# Patient Record
Sex: Male | Born: 1960
Health system: Southern US, Community
[De-identification: ages and names within clinical notes are randomized; demographics above are authoritative.]

## PROBLEM LIST (undated history)

## (undated) DIAGNOSIS — K219 Gastro-esophageal reflux disease without esophagitis: Secondary | ICD-10-CM

## (undated) DIAGNOSIS — F419 Anxiety disorder, unspecified: Secondary | ICD-10-CM

## (undated) DIAGNOSIS — I10 Essential (primary) hypertension: Secondary | ICD-10-CM

## (undated) DIAGNOSIS — R0602 Shortness of breath: Secondary | ICD-10-CM

## (undated) DIAGNOSIS — M109 Gout, unspecified: Secondary | ICD-10-CM

## (undated) DIAGNOSIS — L719 Rosacea, unspecified: Secondary | ICD-10-CM

## (undated) HISTORY — PX: BREAST LUMPECTOMY: SHX2

---

## 2006-08-12 ENCOUNTER — Emergency Department: Payer: Self-pay | Admitting: Emergency Medicine

## 2007-04-24 ENCOUNTER — Other Ambulatory Visit: Payer: Self-pay

## 2007-04-24 ENCOUNTER — Emergency Department: Payer: Self-pay | Admitting: Emergency Medicine

## 2007-09-09 ENCOUNTER — Emergency Department: Payer: Self-pay | Admitting: Emergency Medicine

## 2010-11-06 ENCOUNTER — Emergency Department: Payer: Self-pay | Admitting: Emergency Medicine

## 2012-02-26 ENCOUNTER — Ambulatory Visit: Payer: Self-pay | Admitting: Sports Medicine

## 2012-03-17 ENCOUNTER — Ambulatory Visit: Payer: Self-pay | Admitting: Sports Medicine

## 2012-04-23 ENCOUNTER — Other Ambulatory Visit: Payer: Self-pay | Admitting: Neurosurgery

## 2012-05-05 ENCOUNTER — Encounter (HOSPITAL_COMMUNITY): Payer: Self-pay

## 2012-05-09 ENCOUNTER — Other Ambulatory Visit (HOSPITAL_COMMUNITY): Payer: Self-pay

## 2012-05-12 ENCOUNTER — Encounter (HOSPITAL_COMMUNITY): Payer: Self-pay

## 2012-05-12 ENCOUNTER — Encounter (HOSPITAL_COMMUNITY)
Admission: RE | Admit: 2012-05-12 | Discharge: 2012-05-12 | Disposition: A | Discharge status: Home / Self Care | Payer: BC Managed Care – PPO | Source: Ambulatory Visit | Attending: Neurosurgery | Admitting: Neurosurgery

## 2012-05-12 HISTORY — DX: Shortness of breath: R06.02

## 2012-05-12 HISTORY — DX: Gastro-esophageal reflux disease without esophagitis: K21.9

## 2012-05-12 HISTORY — DX: Gout, unspecified: M10.9

## 2012-05-12 LAB — SURGICAL PCR SCREEN
MRSA, PCR: NEGATIVE
Staphylococcus aureus: NEGATIVE

## 2012-05-12 NOTE — Pre-Procedure Instructions (Signed)
Fordyce Lepak  05/12/2012   Your procedure is scheduled on:  05-19-2012  Report to [facility] at 5:30  AM.  Call this number if you have problems the morning of surgery: [phone]   Remember:   Do not eat food or drink liquids after midnight.   Take these medicines the morning of surgery with A SIP OF WATER:Allopurinol    Do not wear jewelry Do not wear lotions, powders, or perfumes. You may wear deodorant.  Do not shave 48 hours prior to surgery. Men may shave face and neck.  Do not bring valuables to the hospital.  Contacts, dentures or bridgework may not be worn into surgery.  Leave suitcase in the car. After surgery it may be brought to your room.   For patients admitted to the hospital, checkout time is 11:00 AM the day of  discharge.   Patients discharged the day of surgery will not be allowed to drive home.   Special Instructions: Shower using CHG 2 nights before surgery and the night before surgery.  If you shower the day of surgery use CHG.  Use special wash - you have one bottle of CHG for all showers.  You should use approximately 1/3 of the bottle for each shower.            STOP ALL ASPIRIN,NASIDS, AND BLOOD THINNERS   Please read over the following fact sheets that you were given: Pain Booklet, Coughing and Deep Breathing and Surgical Site Infection Prevention

## 2012-05-12 NOTE — Progress Notes (Signed)
Pt. Had labs drawn last week @ Dr. Minus Liberty ,[facility],requested copy of lab work.  Requested Stress test ,Echo and Sleep study from Dr. Earnestine Leys [facility].  Pt. Never given results from sleep study, new pcp to follow up .,he is aware that pt. Is at risk for sleep apnea.

## 2012-05-15 NOTE — Progress Notes (Signed)
Received office notes and Echo report from Dr. Juel Burrow in [place].  I called the office and they state they do not have a sleep study report. Also no stress test report.

## 2012-05-18 MED ORDER — DEXTROSE 5 % IV SOLN
3.0000 g | INTRAVENOUS | Status: AC
  Administered 2012-05-19: 3 g via INTRAVENOUS
  Filled 2012-05-18: qty 3000

## 2012-05-19 ENCOUNTER — Ambulatory Visit (HOSPITAL_COMMUNITY): Payer: BC Managed Care – PPO

## 2012-05-19 ENCOUNTER — Observation Stay (HOSPITAL_COMMUNITY)
Admission: RE | Admit: 2012-05-19 | Discharge: 2012-05-20 | DRG: 864 | Disposition: A | Discharge status: Home / Self Care | Payer: BC Managed Care – PPO | Source: Ambulatory Visit | Attending: Neurosurgery | Admitting: Neurosurgery

## 2012-05-19 ENCOUNTER — Encounter (HOSPITAL_COMMUNITY): Payer: Self-pay | Admitting: *Deleted

## 2012-05-19 ENCOUNTER — Encounter (HOSPITAL_COMMUNITY)
Admission: RE | Disposition: A | Discharge status: Home / Self Care | Payer: Self-pay | Source: Ambulatory Visit | Attending: Neurosurgery

## 2012-05-19 ENCOUNTER — Encounter (HOSPITAL_COMMUNITY): Payer: Self-pay | Admitting: Certified Registered"

## 2012-05-19 ENCOUNTER — Ambulatory Visit (HOSPITAL_COMMUNITY): Payer: BC Managed Care – PPO | Admitting: Certified Registered"

## 2012-05-19 DIAGNOSIS — Z79899 Other long term (current) drug therapy: Secondary | ICD-10-CM | POA: Insufficient documentation

## 2012-05-19 DIAGNOSIS — Z01812 Encounter for preprocedural laboratory examination: Secondary | ICD-10-CM | POA: Insufficient documentation

## 2012-05-19 DIAGNOSIS — M4712 Other spondylosis with myelopathy, cervical region: Secondary | ICD-10-CM | POA: Insufficient documentation

## 2012-05-19 DIAGNOSIS — Z01810 Encounter for preprocedural cardiovascular examination: Secondary | ICD-10-CM | POA: Insufficient documentation

## 2012-05-19 DIAGNOSIS — K219 Gastro-esophageal reflux disease without esophagitis: Secondary | ICD-10-CM | POA: Insufficient documentation

## 2012-05-19 DIAGNOSIS — M5000 Cervical disc disorder with myelopathy, unspecified cervical region: Principal | ICD-10-CM | POA: Insufficient documentation

## 2012-05-19 DIAGNOSIS — G4730 Sleep apnea, unspecified: Secondary | ICD-10-CM | POA: Insufficient documentation

## 2012-05-19 DIAGNOSIS — M109 Gout, unspecified: Secondary | ICD-10-CM | POA: Insufficient documentation

## 2012-05-19 HISTORY — PX: ANTERIOR CERVICAL DECOMP/DISCECTOMY FUSION: SHX1161

## 2012-05-19 SURGERY — ANTERIOR CERVICAL DECOMPRESSION/DISCECTOMY FUSION 2 LEVELS
Anesthesia: General | Site: Spine Cervical | Wound class: Clean

## 2012-05-19 MED ORDER — EPHEDRINE SULFATE 50 MG/ML IJ SOLN
INTRAMUSCULAR | Status: DC | PRN
  Administered 2012-05-19: 10 mg via INTRAVENOUS

## 2012-05-19 MED ORDER — ONDANSETRON HCL 4 MG/2ML IJ SOLN
INTRAMUSCULAR | Status: DC | PRN
  Administered 2012-05-19: 4 mg via INTRAVENOUS

## 2012-05-19 MED ORDER — THERA M PLUS PO TABS: 1.0000 | ORAL_TABLET | Freq: Every day | ORAL | Status: DC

## 2012-05-19 MED ORDER — THROMBIN 5000 UNITS EX SOLR
CUTANEOUS | Status: DC | PRN
  Administered 2012-05-19 (×2): 5000 [IU] via TOPICAL

## 2012-05-19 MED ORDER — LORAZEPAM 2 MG/ML IJ SOLN: 1.0000 mg | Freq: Four times a day (QID) | INTRAMUSCULAR | Status: DC | PRN

## 2012-05-19 MED ORDER — ALLOPURINOL 300 MG PO TABS
300.0000 mg | ORAL_TABLET | Freq: Every day | ORAL | Status: DC
  Administered 2012-05-20: 300 mg via ORAL
  Filled 2012-05-19 (×2): qty 1

## 2012-05-19 MED ORDER — MORPHINE SULFATE 2 MG/ML IJ SOLN
1.0000 mg | INTRAMUSCULAR | Status: DC | PRN
  Administered 2012-05-19: 4 mg via INTRAVENOUS
  Filled 2012-05-19: qty 2

## 2012-05-19 MED ORDER — CEFAZOLIN SODIUM-DEXTROSE 2-3 GM-% IV SOLR
2.0000 g | Freq: Three times a day (TID) | INTRAVENOUS | Status: AC
  Administered 2012-05-19 (×2): 2 g via INTRAVENOUS
  Filled 2012-05-19 (×2): qty 50

## 2012-05-19 MED ORDER — NEOSTIGMINE METHYLSULFATE 1 MG/ML IJ SOLN
INTRAMUSCULAR | Status: DC | PRN
  Administered 2012-05-19: 4 mg via INTRAVENOUS

## 2012-05-19 MED ORDER — GLYCOPYRROLATE 0.2 MG/ML IJ SOLN
INTRAMUSCULAR | Status: DC | PRN
  Administered 2012-05-19: 0.6 mg via INTRAVENOUS

## 2012-05-19 MED ORDER — ROCURONIUM BROMIDE 100 MG/10ML IV SOLN
INTRAVENOUS | Status: DC | PRN
  Administered 2012-05-19: 50 mg via INTRAVENOUS

## 2012-05-19 MED ORDER — LORAZEPAM 0.5 MG PO TABS: 1.0000 mg | ORAL_TABLET | Freq: Four times a day (QID) | ORAL | Status: DC | PRN

## 2012-05-19 MED ORDER — SODIUM CHLORIDE 0.9 % IR SOLN
Status: DC | PRN
  Administered 2012-05-19: 10:00:00

## 2012-05-19 MED ORDER — LACTATED RINGERS IV SOLN: INTRAVENOUS | Status: DC

## 2012-05-19 MED ORDER — FENTANYL CITRATE 0.05 MG/ML IJ SOLN
INTRAMUSCULAR | Status: DC | PRN
  Administered 2012-05-19: 250 ug via INTRAVENOUS
  Administered 2012-05-19: 50 ug via INTRAVENOUS
  Administered 2012-05-19: 25 ug via INTRAVENOUS

## 2012-05-19 MED ORDER — VECURONIUM BROMIDE 10 MG IV SOLR
INTRAVENOUS | Status: DC | PRN
  Administered 2012-05-19: 2 mg via INTRAVENOUS

## 2012-05-19 MED ORDER — SODIUM CHLORIDE 0.9 % IV SOLN
INTRAVENOUS | Status: AC
  Filled 2012-05-19: qty 500

## 2012-05-19 MED ORDER — PHENYLEPHRINE HCL 10 MG/ML IJ SOLN
INTRAMUSCULAR | Status: DC | PRN
  Administered 2012-05-19 (×2): 80 ug via INTRAVENOUS

## 2012-05-19 MED ORDER — OXYCODONE-ACETAMINOPHEN 5-325 MG PO TABS
1.0000 | ORAL_TABLET | ORAL | Status: DC | PRN
  Administered 2012-05-19 – 2012-05-20 (×4): 2 via ORAL
  Filled 2012-05-19 (×4): qty 2

## 2012-05-19 MED ORDER — ADULT MULTIVITAMIN W/MINERALS CH: 1.0000 | ORAL_TABLET | Freq: Every day | ORAL | Status: DC

## 2012-05-19 MED ORDER — HEMOSTATIC AGENTS (NO CHARGE) OPTIME
TOPICAL | Status: DC | PRN
  Administered 2012-05-19: 1 via TOPICAL

## 2012-05-19 MED ORDER — VITAMIN B-1 100 MG PO TABS
100.0000 mg | ORAL_TABLET | Freq: Every day | ORAL | Status: DC
  Administered 2012-05-19 – 2012-05-20 (×2): 100 mg via ORAL
  Filled 2012-05-19 (×2): qty 1

## 2012-05-19 MED ORDER — LIDOCAINE HCL 4 % MT SOLN
OROMUCOSAL | Status: DC | PRN
  Administered 2012-05-19: 4 mL via TOPICAL

## 2012-05-19 MED ORDER — DEXAMETHASONE SODIUM PHOSPHATE 10 MG/ML IJ SOLN
INTRAMUSCULAR | Status: DC | PRN
  Administered 2012-05-19: 10 mg via INTRAVENOUS

## 2012-05-19 MED ORDER — LIDOCAINE HCL (CARDIAC) 20 MG/ML IV SOLN
INTRAVENOUS | Status: DC | PRN
  Administered 2012-05-19: 70 mg via INTRAVENOUS

## 2012-05-19 MED ORDER — ONDANSETRON HCL 4 MG/2ML IJ SOLN
INTRAMUSCULAR | Status: AC
  Filled 2012-05-19: qty 2

## 2012-05-19 MED ORDER — BACITRACIN ZINC 500 UNIT/GM EX OINT
TOPICAL_OINTMENT | CUTANEOUS | Status: DC | PRN
  Administered 2012-05-19: 1 via TOPICAL

## 2012-05-19 MED ORDER — LACTATED RINGERS IV SOLN
INTRAVENOUS | Status: DC | PRN
  Administered 2012-05-19 (×2): via INTRAVENOUS

## 2012-05-19 MED ORDER — HYDROMORPHONE HCL PF 1 MG/ML IJ SOLN: 0.2500 mg | INTRAMUSCULAR | Status: DC | PRN

## 2012-05-19 MED ORDER — DEXAMETHASONE 4 MG PO TABS
4.0000 mg | ORAL_TABLET | Freq: Four times a day (QID) | ORAL | Status: AC
  Administered 2012-05-19 – 2012-05-20 (×2): 4 mg via ORAL
  Filled 2012-05-19 (×2): qty 1

## 2012-05-19 MED ORDER — MORPHINE SULFATE 2 MG/ML IJ SOLN
INTRAMUSCULAR | Status: AC
  Administered 2012-05-19: 2 mg via INTRAVENOUS
  Filled 2012-05-19: qty 1

## 2012-05-19 MED ORDER — BACITRACIN 50000 UNITS IM SOLR
INTRAMUSCULAR | Status: AC
  Filled 2012-05-19: qty 1

## 2012-05-19 MED ORDER — 0.9 % SODIUM CHLORIDE (POUR BTL) OPTIME
TOPICAL | Status: DC | PRN
  Administered 2012-05-19: 1000 mL

## 2012-05-19 MED ORDER — DEXAMETHASONE SODIUM PHOSPHATE 4 MG/ML IJ SOLN
4.0000 mg | Freq: Four times a day (QID) | INTRAMUSCULAR | Status: AC
  Administered 2012-05-19: 4 mg via INTRAVENOUS
  Filled 2012-05-19: qty 1

## 2012-05-19 MED ORDER — DIAZEPAM 5 MG PO TABS
5.0000 mg | ORAL_TABLET | Freq: Four times a day (QID) | ORAL | Status: DC | PRN
  Administered 2012-05-19 – 2012-05-20 (×3): 5 mg via ORAL
  Filled 2012-05-19 (×4): qty 1

## 2012-05-19 MED ORDER — PROPOFOL 10 MG/ML IV BOLUS
INTRAVENOUS | Status: DC | PRN
  Administered 2012-05-19: 50 mg via INTRAVENOUS
  Administered 2012-05-19: 350 mg via INTRAVENOUS

## 2012-05-19 MED ORDER — THIAMINE HCL 100 MG/ML IJ SOLN
100.0000 mg | Freq: Every day | INTRAMUSCULAR | Status: DC
  Filled 2012-05-19 (×2): qty 1

## 2012-05-19 MED ORDER — BUPIVACAINE-EPINEPHRINE PF 0.5-1:200000 % IJ SOLN
INTRAMUSCULAR | Status: DC | PRN
  Administered 2012-05-19: 10 mL

## 2012-05-19 MED ORDER — ACETAMINOPHEN 650 MG RE SUPP: 650.0000 mg | RECTAL | Status: DC | PRN

## 2012-05-19 MED ORDER — ONDANSETRON HCL 4 MG/2ML IJ SOLN: 4.0000 mg | INTRAMUSCULAR | Status: DC | PRN

## 2012-05-19 MED ORDER — ACETAMINOPHEN 325 MG PO TABS: 650.0000 mg | ORAL_TABLET | ORAL | Status: DC | PRN

## 2012-05-19 MED ORDER — ONDANSETRON HCL 4 MG/2ML IJ SOLN
4.0000 mg | Freq: Once | INTRAMUSCULAR | Status: AC | PRN
  Administered 2012-05-19: 4 mg via INTRAVENOUS

## 2012-05-19 MED ORDER — HYDROCODONE-ACETAMINOPHEN 5-325 MG PO TABS: 1.0000 | ORAL_TABLET | ORAL | Status: DC | PRN

## 2012-05-19 MED ORDER — MENTHOL 3 MG MT LOZG
1.0000 | LOZENGE | OROMUCOSAL | Status: DC | PRN
  Filled 2012-05-19: qty 9

## 2012-05-19 MED ORDER — SUCCINYLCHOLINE CHLORIDE 20 MG/ML IJ SOLN
INTRAMUSCULAR | Status: DC | PRN
  Administered 2012-05-19: 120 mg via INTRAVENOUS

## 2012-05-19 MED ORDER — DOCUSATE SODIUM 100 MG PO CAPS
100.0000 mg | ORAL_CAPSULE | Freq: Two times a day (BID) | ORAL | Status: DC
  Administered 2012-05-19 – 2012-05-20 (×2): 100 mg via ORAL
  Filled 2012-05-19 (×2): qty 1

## 2012-05-19 MED ORDER — PHENOL 1.4 % MT LIQD: 1.0000 | OROMUCOSAL | Status: DC | PRN

## 2012-05-19 MED ORDER — ALUM & MAG HYDROXIDE-SIMETH 200-200-20 MG/5ML PO SUSP: 30.0000 mL | Freq: Four times a day (QID) | ORAL | Status: DC | PRN

## 2012-05-19 MED ORDER — ADULT MULTIVITAMIN W/MINERALS CH
1.0000 | ORAL_TABLET | Freq: Every day | ORAL | Status: DC
  Administered 2012-05-19 – 2012-05-20 (×2): 1 via ORAL
  Filled 2012-05-19 (×2): qty 1

## 2012-05-19 MED ORDER — FOLIC ACID 1 MG PO TABS
1.0000 mg | ORAL_TABLET | Freq: Every day | ORAL | Status: DC
  Administered 2012-05-19 – 2012-05-20 (×2): 1 mg via ORAL
  Filled 2012-05-19 (×2): qty 1

## 2012-05-19 SURGICAL SUPPLY — 62 items
BAG DECANTER FOR FLEXI CONT (MISCELLANEOUS) ×2 IMPLANT
BENZOIN TINCTURE PRP APPL 2/3 (GAUZE/BANDAGES/DRESSINGS) ×2 IMPLANT
BIT DRILL NEURO 2X3.1 SFT TUCH (MISCELLANEOUS) ×1 IMPLANT
BLADE SURG 15 STRL LF DISP TIS (BLADE) ×1 IMPLANT
BLADE SURG 15 STRL SS (BLADE) ×1
BLADE ULTRA TIP 2M (BLADE) ×2 IMPLANT
BRUSH SCRUB EZ PLAIN DRY (MISCELLANEOUS) ×4 IMPLANT
BUR BARREL STRAIGHT FLUTE 4.0 (BURR) ×2 IMPLANT
BUR MATCHSTICK NEURO 3.0 LAGG (BURR) ×2 IMPLANT
CANISTER SUCTION 2500CC (MISCELLANEOUS) ×2 IMPLANT
CLOTH BEACON ORANGE TIMEOUT ST (SAFETY) ×2 IMPLANT
CONT SPEC 4OZ CLIKSEAL STRL BL (MISCELLANEOUS) ×2 IMPLANT
COVER MAYO STAND STRL (DRAPES) ×2 IMPLANT
DRAIN JACKSON PRATT 10MM FLAT (MISCELLANEOUS) ×2 IMPLANT
DRAPE LAPAROTOMY 100X72 PEDS (DRAPES) ×2 IMPLANT
DRAPE MICROSCOPE LEICA (MISCELLANEOUS) IMPLANT
DRAPE POUCH INSTRU U-SHP 10X18 (DRAPES) ×2 IMPLANT
DRAPE PROXIMA HALF (DRAPES) ×2 IMPLANT
DRAPE SURG 17X23 STRL (DRAPES) ×4 IMPLANT
DRILL NEURO 2X3.1 SOFT TOUCH (MISCELLANEOUS) ×2
ELECT BLADE 4.0 EZ CLEAN MEGAD (MISCELLANEOUS) ×2
ELECT REM PT RETURN 9FT ADLT (ELECTROSURGICAL) ×2
ELECTRODE BLDE 4.0 EZ CLN MEGD (MISCELLANEOUS) ×1 IMPLANT
ELECTRODE REM PT RTRN 9FT ADLT (ELECTROSURGICAL) ×1 IMPLANT
EVACUATOR SILICONE 100CC (DRAIN) ×2 IMPLANT
GAUZE SPONGE 4X4 16PLY XRAY LF (GAUZE/BANDAGES/DRESSINGS) IMPLANT
GLOVE BIO SURGEON STRL SZ8.5 (GLOVE) ×2 IMPLANT
GLOVE EXAM NITRILE LRG STRL (GLOVE) IMPLANT
GLOVE EXAM NITRILE MD LF STRL (GLOVE) IMPLANT
GLOVE EXAM NITRILE XL STR (GLOVE) IMPLANT
GLOVE EXAM NITRILE XS STR PU (GLOVE) IMPLANT
GLOVE SS BIOGEL STRL SZ 8 (GLOVE) ×1 IMPLANT
GLOVE SUPERSENSE BIOGEL SZ 8 (GLOVE) ×1
GOWN BRE IMP SLV AUR LG STRL (GOWN DISPOSABLE) IMPLANT
GOWN BRE IMP SLV AUR XL STRL (GOWN DISPOSABLE) ×2 IMPLANT
KIT BASIN OR (CUSTOM PROCEDURE TRAY) ×2 IMPLANT
KIT ROOM TURNOVER OR (KITS) ×2 IMPLANT
MARKER SKIN DUAL TIP RULER LAB (MISCELLANEOUS) ×2 IMPLANT
NEEDLE HYPO 22GX1.5 SAFETY (NEEDLE) ×2 IMPLANT
NEEDLE SPNL 18GX3.5 QUINCKE PK (NEEDLE) ×2 IMPLANT
NS IRRIG 1000ML POUR BTL (IV SOLUTION) ×2 IMPLANT
PACK LAMINECTOMY NEURO (CUSTOM PROCEDURE TRAY) ×2 IMPLANT
PEEK VISTA 14X14X7MM (Peek) ×2 IMPLANT
PIN DISTRACTION 14MM (PIN) ×4 IMPLANT
PLATE ANT CERV XTEND 2 LV 32 (Plate) ×2 IMPLANT
PUTTY 2.5ML ACTIFUSE ABX (Putty) ×2 IMPLANT
PUTTY ABX ACTIFUSE 1.5ML (Putty) ×2 IMPLANT
RUBBERBAND STERILE (MISCELLANEOUS) IMPLANT
SCREW XTD VAR 4.2 SELF TAP (Screw) ×12 IMPLANT
SPONGE GAUZE 4X4 12PLY (GAUZE/BANDAGES/DRESSINGS) ×2 IMPLANT
SPONGE INTESTINAL PEANUT (DISPOSABLE) ×4 IMPLANT
SPONGE SURGIFOAM ABS GEL SZ50 (HEMOSTASIS) ×2 IMPLANT
STRIP CLOSURE SKIN 1/2X4 (GAUZE/BANDAGES/DRESSINGS) ×2 IMPLANT
SUT VIC AB 0 CT1 27 (SUTURE) ×1
SUT VIC AB 0 CT1 27XBRD ANTBC (SUTURE) ×1 IMPLANT
SUT VIC AB 3-0 SH 8-18 (SUTURE) ×2 IMPLANT
SYR 20ML ECCENTRIC (SYRINGE) ×2 IMPLANT
TAPE CLOTH SURG 4X10 WHT LF (GAUZE/BANDAGES/DRESSINGS) ×2 IMPLANT
TOWEL OR 17X24 6PK STRL BLUE (TOWEL DISPOSABLE) ×2 IMPLANT
TOWEL OR 17X26 10 PK STRL BLUE (TOWEL DISPOSABLE) ×2 IMPLANT
VISTA S 14X14X7 (Spacer) ×2 IMPLANT
WATER STERILE IRR 1000ML POUR (IV SOLUTION) ×2 IMPLANT

## 2012-05-19 NOTE — Preoperative (Signed)
Beta Blockers   Reason not to administer Beta Blockers:Not Applicable 

## 2012-05-19 NOTE — Progress Notes (Signed)
UR COMPLETED  

## 2012-05-19 NOTE — Op Note (Signed)
Brief history: The patient is a 52 year old white male who has complained of neck and arm pain numbness and tingling consistent with a cervical radiculopathy. He has failed medical management and was worked up with a cervical MRI. This demonstrated multilevel disc degeneration and spondylosis. He had significant spinal stenosis at C5-6 and C6-7. I discussed the various treatment with the patient including surgery. The patient has weighed the risks, benefits, and alternatives surgery and decided proceed with a C5-6 and C6-7 anterior cervical discectomy, fusion, and plating.  Preoperative diagnosis: C5-6 and C6-7 spondylosis, disc degeneration, stenosis, cervical myelopathy, cervical radiculopathy, cervicalgia  Postoperative diagnosis: The same  Procedure: C5-6 and C6-7 Anterior cervical discectomy/decompression; C5-C6 and C6-7 interbody arthrodesis with local morcellized autograft bone and Actifuse bone graft extender; insertion of interbody prosthesis at C5-6 and C6-7 (Zimmer peek interbody prosthesis); anterior cervical plating from C5-C7 with globus titanium plate  Surgeon: Dr. Delma Officer  Asst.: Dr. Hilda Lias  Anesthesia: Gen. endotracheal  Estimated blood loss: 125 cc  Drains: One 10 mm flat Jackson-Pratt drain in the prevertebral space.  Complications: None  Description of procedure: The patient was brought to the operating room by the anesthesia team. General endotracheal anesthesia was induced. A roll was placed under the patient's shoulders to keep the neck in the neutral position. The patient's anterior cervical region was then prepared with Betadine scrub and Betadine solution. Sterile drapes were applied.  The area to be incised was then injected with Marcaine with epinephrine solution. I then used a scalpel to make a transverse incision in the patient's left anterior neck. I used the Metzenbaum scissors to divide the platysmal muscle and then to dissect medial to the  sternocleidomastoid muscle, jugular vein, and carotid artery. I carefully dissected down towards the anterior cervical spine identifying the esophagus and retracting it medially. Then using Kitner swabs to clear soft tissue from the anterior cervical spine. We then inserted a bent spinal needle into the upper exposed intervertebral disc space. We then obtained intraoperative radiographs confirm our location.  I then used electrocautery to detach the medial border of the longus colli muscle bilaterally from the C5-6 and C6-7 intervertebral disc spaces. I then inserted the Caspar self-retaining retractor underneath the longus colli muscle bilaterally to provide exposure.  We then incised the intervertebral disc at C5-6 . We then performed a partial intervertebral discectomy with a pituitary forceps and the Karlin curettes. I then inserted distraction screws into the vertebral bodies at C5 and C6. We then distracted the interspace. We then used the high-speed drill to decorticate the vertebral endplates at C5-6, to drill away the remainder of the intervertebral disc, to drill away some posterior spondylosis, and to thin out the posterior longitudinal ligament. I then incised ligament with the arachnoid knife. We then removed the ligament with a Kerrison punches undercutting the vertebral endplates and decompressing the thecal sac. We then performed foraminotomies about the bilateral C6 nerve roots. This completed the decompression at this level.  We then repeated this procedure in an analogous fashion at C6-7 decompressing the thecal sac and the bilateral C7 nerve roots  We now turned our to attention to the interbody fusion. We used the trial spacers to determine the appropriate size for the interbody prosthesis. We then pre-filled prosthesis with a combination of local morcellized autograft bone that we obtained during decompression as well as Actifuse bone graft extender. We then inserted the prosthesis into  the distracted interspace at C5-6 and C6-7. We then removed the distraction  screws. There was a good snug fit of the prosthesis in the interspace.   Having completed the fusion we now turned attention to the anterior spinal instrumentation. We used the high-speed drill to drill away some anterior spondylosis at the disc spaces so that the plate lay down flat. We selected the appropriate length titanium anterior cervical plate. We laid it along the anterior aspect of the vertebral bodies from C5-C7. We then drilled 14 mm holes at C5, C6 and C7. We then secured the plate to the vertebral bodies by placing two 14 mm self-tapping screws at C5, C6 and C7. We then obtained intraoperative radiograph. Because of the patient's body habitus we could not see the instrumentation. It however looked good in vivo we therefore secured the screws the plate the locking each cam. This completed the instrumentation.  We then obtained hemostasis using bipolar electrocautery. We irrigated the wound out with bacitracin solution. We then removed the retractor. We inspected the esophagus for any damage. There was none apparent. We then placed a 10 mm flat Jackson-Pratt drain in the prevertebral space. I tunneled it out through separate stab wound. We then reapproximated patient's platysmal muscle with interrupted 3-0 Vicryl suture. We then reapproximated the subcutaneous tissue with interrupted 3-0 Vicryl suture. The skin was reapproximated with Steri-Strips and benzoin. The wound was then covered with bacitracin ointment. A sterile dressing was applied. The drapes were removed. Patient was subsequently extubated by the anesthesia team and transported to the post anesthesia care unit in stable condition. All sponge instrument and needle counts were reportedly correct at the end of this case.

## 2012-05-19 NOTE — Progress Notes (Signed)
Copy of CBC and CMET in chart from [facility].

## 2012-05-19 NOTE — Plan of Care (Signed)
Problem: Consults Goal: Diagnosis - Spinal Surgery Outcome: Completed/Met Date Met:  05/19/12 Cervical Spine Fusion     

## 2012-05-19 NOTE — Transfer of Care (Signed)
Immediate Anesthesia Transfer of Care Note  Patient: Timothy Brown  Procedure(s) Performed: Procedure(s) with comments: ANTERIOR CERVICAL DECOMPRESSION/DISCECTOMY FUSION 2 LEVELS (N/A) - C56 C67 anterior cervical decompression with fusion interbody prothesis plating and bonegraft  Patient Location: PACU  Anesthesia Type:General  Level of Consciousness: awake, alert , oriented and patient cooperative  Airway & Oxygen Therapy: Patient Spontanous Breathing and Patient connected to nasal cannula oxygen  Post-op Assessment: Report given to PACU RN, Post -op Vital signs reviewed and stable and Patient moving all extremities  Post vital signs: Reviewed and stable  Complications: No apparent anesthesia complications

## 2012-05-19 NOTE — Progress Notes (Signed)
Subjective:  The patient is alert and pleasant. He is in no apparent distress. He looks well.  Objective: Vital signs in last 24 hours: Temp:  [97.5 F (36.4 C)-99 F (37.2 C)] 99 F (37.2 C) (03/17 1350) Pulse Rate:  [71-85] 79 (03/17 1350) Resp:  [12-28] 18 (03/17 1350) BP: (123-150)/(71-102) 142/97 mmHg (03/17 1350) SpO2:  [91 %-96 %] 96 % (03/17 1350)  Intake/Output from previous day:   Intake/Output this shift: Total I/O In: 1015 [I.V.:1000; Other:15] Out: 200 [Blood:200]  Physical exam the patient is alert and pleasant. He is moving all 4 extremities well. His dressing is clean and dry. There is no evidence of hematoma or shift.  Lab Results: No results found for this basename: WBC, HGB, HCT, PLT,  in the last 72 hours BMET No results found for this basename: NA, K, CL, CO2, GLUCOSE, BUN, CREATININE, CALCIUM,  in the last 72 hours  Studies/Results: Dg Cervical Spine 2-3 Views  05/19/2012  *RADIOLOGY REPORT*  Clinical Data: 52 year old male undergoing cervical spine surgery.  CERVICAL SPINE - 2-3 VIEW  Comparison: None.  Findings: Two intraoperative portable cross-table lateral views of the cervical spine.  Film #1 at 0907 hours demonstrating a surgical probe at the C2-C3 disc level. Film #2 at 1117 hours.  Surgical lap pad anteriorly.  No definite cervical spine hardware visible.  The levels below C3 are inadequately penetrated.  IMPRESSION: Intraoperative views of the cervical spine as above.   Original Report Authenticated By: Erskine Speed, M.D.     Assessment/Plan: The patient is doing well.  LOS: 0 days     Tiena Manansala D 05/19/2012, 1:58 PM

## 2012-05-19 NOTE — Anesthesia Postprocedure Evaluation (Signed)
  Anesthesia Post-op Note  Patient: Timothy Brown  Procedure(s) Performed: Procedure(s) with comments: ANTERIOR CERVICAL DECOMPRESSION/DISCECTOMY FUSION 2 LEVELS (N/A) - C56 C67 anterior cervical decompression with fusion interbody prothesis plating and bonegraft  Patient Location: PACU  Anesthesia Type:General  Level of Consciousness: awake  Airway and Oxygen Therapy: Patient Spontanous Breathing and Patient connected to nasal cannula oxygen  Post-op Pain: mild  Post-op Assessment: Post-op Vital signs reviewed, Patient's Cardiovascular Status Stable, Respiratory Function Stable, Patent Airway, No signs of Nausea or vomiting and Pain level controlled  Post-op Vital Signs: stable  Complications: No apparent anesthesia complications

## 2012-05-19 NOTE — H&P (Signed)
Subjective: The patient is a 52 year old white male who has had months of neck and right arm pain numbness and tingling. He has failed medical management and was worked up with a cervical MRI. This demonstrated degenerative changes, spondylosis, stenosis etc. at C5-6 and C6-7. I discussed the various treatment options with the patient including surgery. He has weighed the risks, benefits, and alternatives surgery and decided proceed with C5-6 and C6-7 anterior cervical discectomy, fusion, and plating.   Past Medical History  Diagnosis Date  . Shortness of breath     WITH EXERTION  . GERD (gastroesophageal reflux disease)     TUMS  . Gout     Past Surgical History  Procedure Laterality Date  . Breast lumpectomy Left     LUMP ON NIPPLE @ 52 YRS OLD    No Known Allergies  History  Substance Use Topics  . Smoking status: Never Smoker   . Smokeless tobacco: Not on file  . Alcohol Use: 0.0 oz/week    4-6 Cans of beer per week    History reviewed. No pertinent family history. Prior to Admission medications   Medication Sig Start Date End Date Taking? Authorizing Provider  allopurinol (ZYLOPRIM) 300 MG tablet Take 300 mg by mouth daily.   Yes Historical Provider, MD  cholecalciferol (VITAMIN D) 1000 UNITS tablet Take 1,000 Units by mouth daily.   Yes Historical Provider, MD  ibuprofen (ADVIL,MOTRIN) 200 MG tablet Take 600 mg by mouth 2 (two) times daily as needed for pain.   Yes Historical Provider, MD  Multiple Vitamins-Minerals (MULTIVITAMINS THER. W/MINERALS) TABS Take 1 tablet by mouth daily.   Yes Historical Provider, MD  vitamin C (ASCORBIC ACID) 500 MG tablet Take 500 mg by mouth daily.   Yes Historical Provider, MD     Review of Systems  Positive ROS: As above  All other systems have been reviewed and were otherwise negative with the exception of those mentioned in the HPI and as above.  Objective: Vital signs in last 24 hours: Temp:  [97.6 F (36.4 C)] 97.6 F (36.4 C)  (03/17 0626) Pulse Rate:  [71] 71 (03/17 0626) Resp:  [20] 20 (03/17 0626) BP: (150)/(102) 150/102 mmHg (03/17 0626) SpO2:  [96 %] 96 % (03/17 0626)  General Appearance: Alert, cooperative, no distress, appears stated age Head: Normocephalic, without obvious abnormality, atraumatic Eyes: PERRL, conjunctiva/corneas clear, EOM's intact, fundi benign, both eyes      Ears: Normal TM's and external ear canals, both ears Throat: Lips, mucosa, and tongue normal; teeth and gums normal Neck: Supple, symmetrical, trachea midline, no adenopathy; thyroid: No enlargement/tenderness/nodules; no carotid bruit or JVD. Spurling's testing is positive on the right. Back: Symmetric, no curvature, ROM normal, no CVA tenderness Lungs: Clear to auscultation bilaterally, respirations unlabored Heart: Regular rate and rhythm, S1 and S2 normal, no murmur, rub or gallop Abdomen: Soft, non-tender, bowel sounds active all four quadrants, no masses, no organomegaly Extremities: Extremities normal, atraumatic, no cyanosis or edema Pulses: 2+ and symmetric all extremities Skin: Skin color, texture, turgor normal, no rashes or lesions  NEUROLOGIC:   Mental status: alert and oriented, no aphasia, good attention span, Fund of knowledge/ memory ok Motor Exam - grossly normal Sensory Exam - grossly normal except he has some decreased sensation in his right upper extremity. Reflexes:  Coordination - grossly normal Gait - grossly normal Balance - grossly normal Cranial Nerves: I: smell Not tested  II: visual acuity  OS: Normal    OD: Normal  II: visual fields Full to confrontation  II: pupils Equal, round, reactive to light  III,VII: ptosis None  III,IV,VI: extraocular muscles  Full ROM  V: mastication Normal  V: facial light touch sensation  Normal  V,VII: corneal reflex  Present  VII: facial muscle function - upper  Normal  VII: facial muscle function - lower Normal  VIII: hearing Not tested  IX: soft palate  elevation  Normal  IX,X: gag reflex Present  XI: trapezius strength  5/5  XI: sternocleidomastoid strength 5/5  XI: neck flexion strength  5/5  XII: tongue strength  Normal    Data Review No results found for this basename: WBC, HGB, HCT, MCV, PLT   No results found for this basename: NA, K, CL, CO2, BUN, creatinine, glucose   No results found for this basename: INR, PROTIME    Assessment/Plan: C5-6 and C6-7 disc degeneration, spondylosis, stenosis, cervical radiculopathy/myelopathy, cervicalgia. I discussed the situation with the patient. I reviewed his MR scan with them and pointed out the abnormalities. We have discussed the various treatment options including a C5-6 and C6-7 anterior cervical discectomy, fusion, and plating. I have described the surgery to him. I have shown him surgical models. We have discussed the risks, benefits, alternatives, and likelihood of achieving our goals with surgery. I have answered all the patient's questions. He has decided to proceed with surgery.   Suellen Durocher D 05/19/2012 9:18 AM

## 2012-05-19 NOTE — Progress Notes (Signed)
Pt. coing of left thumb numbness, Dr Lovell Sheehan aware

## 2012-05-19 NOTE — Anesthesia Preprocedure Evaluation (Addendum)
Anesthesia Evaluation  Patient identified by MRN, date of birth, ID band Patient awake    Reviewed: Allergy & Precautions, H&P , NPO status , Patient's Chart, lab work & pertinent test results  History of Anesthesia Complications Negative for: history of anesthetic complications  Airway Mallampati: I TM Distance: >3 FB Neck ROM: Full    Dental  (+) Teeth Intact   Pulmonary sleep apnea ,          Cardiovascular     Neuro/Psych ETOH (3-6 beers/day per patient)    GI/Hepatic GERD-  ,  Endo/Other  Morbid obesity  Renal/GU      Musculoskeletal   Abdominal   Peds  Hematology   Anesthesia Other Findings   Reproductive/Obstetrics                          Anesthesia Physical Anesthesia Plan  ASA: II  Anesthesia Plan: General   Post-op Pain Management:    Induction: Intravenous  Airway Management Planned: Oral ETT  Additional Equipment:   Intra-op Plan:   Post-operative Plan: Extubation in OR  Informed Consent: I have reviewed the patients History and Physical, chart, labs and discussed the procedure including the risks, benefits and alternatives for the proposed anesthesia with the patient or authorized representative who has indicated his/her understanding and acceptance.   Dental advisory given  Plan Discussed with: Anesthesiologist and Surgeon  Anesthesia Plan Comments:         Anesthesia Quick Evaluation

## 2012-05-20 ENCOUNTER — Encounter (HOSPITAL_COMMUNITY): Payer: Self-pay | Admitting: Neurosurgery

## 2012-05-20 MED ORDER — OXYCODONE-ACETAMINOPHEN 10-325 MG PO TABS: 1.0000 | ORAL_TABLET | ORAL | Status: DC | PRN

## 2012-05-20 MED ORDER — DIAZEPAM 5 MG PO TABS: 5.0000 mg | ORAL_TABLET | Freq: Four times a day (QID) | ORAL | Status: DC | PRN

## 2012-05-20 MED ORDER — DSS 100 MG PO CAPS: 100.0000 mg | ORAL_CAPSULE | Freq: Two times a day (BID) | ORAL | Status: DC

## 2012-05-20 NOTE — Progress Notes (Signed)
Pt. Alert and oriented, follows simple instructions, denies pain. Incision area without swelling, redness or S/S of infection. Voiding adequate clear yellow urine. Moving all extremities well and vitals stable and documented. Anterior Cervical Fusion surgery notes instructions given to patient and family member for home safety and precautions. Pt. and family stated understanding of instructions given. Pain meds given per Pt.'s request for pain and discomfort of ride home 

## 2012-05-20 NOTE — Discharge Summary (Signed)
  Physician Discharge Summary  Patient ID: Timothy Brown MRN: [id number] DOB/AGE: 1961-01-07 52 y.o.  Admit date: 05/19/2012 Discharge date: 05/20/2012  Admission Diagnoses: C5-6 and C6-7 disc degeneration, spondylosis, stenosis, cervical radiculopathy, cervical myelopathy, cervicalgia  Discharge Diagnoses: The same Active Problems:   * No active hospital problems. *   Discharged Condition: good  Hospital Course: I admitted the patient [name] 05/19/2012. On that day I performed a C5-C6 and C6-7 anterior cervical discectomy, fusion, and plating. The surgery went well.  The patient's postoperative course was unremarkable. By postop day #1 Timothy Brown requested discharge to home. The patient was given oral and written discharge instructions. All his questions were answered.  Consults: None Significant Diagnostic Studies: None Treatments: C5-6 and C6-7 anterior cervical discectomy, fusion, and plating. Discharge Exam: Blood pressure 140/79, pulse 52, temperature 97.7 F (36.5 C), temperature source Oral, resp. rate 18, SpO2 94.00%. The patient is alert and pleasant. Timothy Brown looks well. His dressing is clean and dry. There is no evidence of hematoma or shift. I removed the drain. His strength is normal.  Disposition: Home     Medication List    STOP taking these medications       ibuprofen 200 MG tablet  Commonly known as:  ADVIL,MOTRIN      TAKE these medications       allopurinol 300 MG tablet  Commonly known as:  ZYLOPRIM  Take 300 mg by mouth daily.     cholecalciferol 1000 UNITS tablet  Commonly known as:  VITAMIN D  Take 1,000 Units by mouth daily.     diazepam 5 MG tablet  Commonly known as:  VALIUM  Take 1 tablet (5 mg total) by mouth every 6 (six) hours as needed.     DSS 100 MG Caps  Take 100 mg by mouth 2 (two) times daily.     multivitamins ther. w/minerals Tabs  Take 1 tablet by mouth daily.     oxyCODONE-acetaminophen 10-325 MG per tablet  Commonly  known as:  PERCOCET  Take 1 tablet by mouth every 4 (four) hours as needed for pain.     vitamin C 500 MG tablet  Commonly known as:  ASCORBIC ACID  Take 500 mg by mouth daily.         SignedTressie Stalker D 05/20/2012, 8:03 AM

## 2013-06-16 DIAGNOSIS — G5600 Carpal tunnel syndrome, unspecified upper limb: Secondary | ICD-10-CM | POA: Insufficient documentation

## 2014-03-02 ENCOUNTER — Ambulatory Visit: Payer: Self-pay | Admitting: Gastroenterology

## 2014-06-28 LAB — SURGICAL PATHOLOGY

## 2015-11-30 DIAGNOSIS — T148XXA Other injury of unspecified body region, initial encounter: Secondary | ICD-10-CM | POA: Insufficient documentation

## 2015-11-30 DIAGNOSIS — R200 Anesthesia of skin: Secondary | ICD-10-CM | POA: Insufficient documentation

## 2015-11-30 DIAGNOSIS — R202 Paresthesia of skin: Secondary | ICD-10-CM | POA: Insufficient documentation

## 2016-06-07 ENCOUNTER — Telehealth: Payer: Self-pay

## 2016-06-07 ENCOUNTER — Other Ambulatory Visit: Payer: Self-pay

## 2016-06-07 DIAGNOSIS — Z860100 Personal history of colon polyps, unspecified: Secondary | ICD-10-CM

## 2016-06-07 NOTE — Telephone Encounter (Signed)
Gastroenterology Pre-Procedure Review  Request Date: 06/21/16 Requesting Physician: Dr. Vicente Males  PATIENT REVIEW QUESTIONS: The patient responded to the following health history questions as indicated:    1. Are you having any GI issues? no 2. Do you have a personal history of Polyps? yes (removed) 3. Do you have a family history of Colon Cancer or Polyps? yes (grandfather & father: colon cancer) 4. Diabetes Mellitus? no 5. Joint replacements in the past 12 months?no 6. Major health problems in the past 3 months?no 7. Any artificial heart valves, MVP, or defibrillator?no    MEDICATIONS & ALLERGIES:    Patient reports the following regarding taking any anticoagulation/antiplatelet therapy:   Plavix, Coumadin, Eliquis, Xarelto, Lovenox, Pradaxa, Brilinta, or Effient? no Aspirin? no  Patient confirms/reports the following medications:  Current Outpatient Prescriptions  Medication Sig Dispense Refill  . allopurinol (ZYLOPRIM) 300 MG tablet Take 300 mg by mouth daily.    . cholecalciferol (VITAMIN D) 1000 UNITS tablet Take 1,000 Units by mouth daily.    . diazepam (VALIUM) 5 MG tablet Take 1 tablet (5 mg total) by mouth every 6 (six) hours as needed. 50 tablet 1  . docusate sodium 100 MG CAPS Take 100 mg by mouth 2 (two) times daily. 60 capsule 1  . Multiple Vitamins-Minerals (MULTIVITAMINS THER. W/MINERALS) TABS Take 1 tablet by mouth daily.    Marland Kitchen oxyCODONE-acetaminophen (PERCOCET) 10-325 MG per tablet Take 1 tablet by mouth every 4 (four) hours as needed for pain. 100 tablet 0  . vitamin C (ASCORBIC ACID) 500 MG tablet Take 500 mg by mouth daily.     No current facility-administered medications for this visit.     Patient confirms/reports the following allergies:  No Known Allergies  No orders of the defined types were placed in this encounter.   AUTHORIZATION INFORMATION Primary Insurance: 1D#: Group #:  Secondary Insurance: 1D#: Group #:  SCHEDULE INFORMATION: Date:  06/21/16 Time: Location: [place]

## 2016-06-08 ENCOUNTER — Telehealth: Payer: Self-pay | Admitting: Gastroenterology

## 2016-06-08 NOTE — Telephone Encounter (Signed)
06/08/16 Spoke with DJ at [facility] and NO prior auth required for Colonoscopy [phone]  [phone].

## 2016-06-15 ENCOUNTER — Telehealth: Payer: Self-pay | Admitting: Gastroenterology

## 2016-06-15 NOTE — Telephone Encounter (Signed)
06/15/16 Spoke with Jeanette Caprice at [facility] and NO prior auth required for colonoscopy [phone] / [phone].

## 2016-06-21 ENCOUNTER — Encounter
Admission: RE | Disposition: A | Discharge status: Home / Self Care | Payer: Self-pay | Source: Ambulatory Visit | Attending: Gastroenterology

## 2016-06-21 ENCOUNTER — Ambulatory Visit: Payer: BLUE CROSS/BLUE SHIELD | Admitting: Anesthesiology

## 2016-06-21 ENCOUNTER — Encounter: Payer: Self-pay | Admitting: Anesthesiology

## 2016-06-21 ENCOUNTER — Ambulatory Visit
Admission: RE | Admit: 2016-06-21 | Discharge: 2016-06-21 | Disposition: A | Discharge status: Home / Self Care | Payer: BLUE CROSS/BLUE SHIELD | Source: Ambulatory Visit | Attending: Gastroenterology | Admitting: Gastroenterology

## 2016-06-21 DIAGNOSIS — M109 Gout, unspecified: Secondary | ICD-10-CM | POA: Diagnosis not present

## 2016-06-21 DIAGNOSIS — R0602 Shortness of breath: Secondary | ICD-10-CM | POA: Insufficient documentation

## 2016-06-21 DIAGNOSIS — K640 First degree hemorrhoids: Secondary | ICD-10-CM | POA: Diagnosis not present

## 2016-06-21 DIAGNOSIS — Z1211 Encounter for screening for malignant neoplasm of colon: Secondary | ICD-10-CM | POA: Diagnosis present

## 2016-06-21 DIAGNOSIS — D122 Benign neoplasm of ascending colon: Secondary | ICD-10-CM | POA: Diagnosis not present

## 2016-06-21 DIAGNOSIS — K5730 Diverticulosis of large intestine without perforation or abscess without bleeding: Secondary | ICD-10-CM | POA: Diagnosis not present

## 2016-06-21 DIAGNOSIS — K219 Gastro-esophageal reflux disease without esophagitis: Secondary | ICD-10-CM | POA: Diagnosis not present

## 2016-06-21 DIAGNOSIS — Z86010 Personal history of colonic polyps: Secondary | ICD-10-CM

## 2016-06-21 DIAGNOSIS — Z981 Arthrodesis status: Secondary | ICD-10-CM | POA: Diagnosis not present

## 2016-06-21 DIAGNOSIS — Z79899 Other long term (current) drug therapy: Secondary | ICD-10-CM | POA: Insufficient documentation

## 2016-06-21 HISTORY — PX: COLONOSCOPY WITH PROPOFOL: SHX5780

## 2016-06-21 SURGERY — COLONOSCOPY WITH PROPOFOL
Anesthesia: General

## 2016-06-21 MED ORDER — FENTANYL CITRATE (PF) 100 MCG/2ML IJ SOLN
INTRAMUSCULAR | Status: DC | PRN
  Administered 2016-06-21 (×2): 50 ug via INTRAVENOUS

## 2016-06-21 MED ORDER — PROPOFOL 500 MG/50ML IV EMUL
INTRAVENOUS | Status: DC | PRN
  Administered 2016-06-21: 100 ug/kg/min via INTRAVENOUS

## 2016-06-21 MED ORDER — SODIUM CHLORIDE 0.9 % IV SOLN
INTRAVENOUS | Status: DC
  Administered 2016-06-21: 10:00:00 via INTRAVENOUS

## 2016-06-21 MED ORDER — LIDOCAINE HCL (PF) 2 % IJ SOLN
INTRAMUSCULAR | Status: DC | PRN
  Administered 2016-06-21: 50 mg

## 2016-06-21 MED ORDER — PROPOFOL 10 MG/ML IV BOLUS
INTRAVENOUS | Status: DC | PRN
  Administered 2016-06-21: 30 mg via INTRAVENOUS
  Administered 2016-06-21: 50 mg via INTRAVENOUS
  Administered 2016-06-21: 30 mg via INTRAVENOUS

## 2016-06-21 MED ORDER — LIDOCAINE HCL (PF) 2 % IJ SOLN
INTRAMUSCULAR | Status: AC
  Filled 2016-06-21: qty 2

## 2016-06-21 MED ORDER — FENTANYL CITRATE (PF) 100 MCG/2ML IJ SOLN
INTRAMUSCULAR | Status: AC
  Filled 2016-06-21: qty 2

## 2016-06-21 MED ORDER — MIDAZOLAM HCL 2 MG/2ML IJ SOLN
INTRAMUSCULAR | Status: AC
  Filled 2016-06-21: qty 2

## 2016-06-21 MED ORDER — MIDAZOLAM HCL 5 MG/5ML IJ SOLN
INTRAMUSCULAR | Status: DC | PRN
  Administered 2016-06-21: 2 mg via INTRAVENOUS

## 2016-06-21 MED ORDER — EPHEDRINE SULFATE 50 MG/ML IJ SOLN
INTRAMUSCULAR | Status: DC | PRN
  Administered 2016-06-21 (×2): 15 mg via INTRAVENOUS
  Administered 2016-06-21: 10 mg via INTRAVENOUS

## 2016-06-21 NOTE — Anesthesia Post-op Follow-up Note (Cosign Needed)
Anesthesia QCDR form completed.        

## 2016-06-21 NOTE — Transfer of Care (Signed)
Immediate Anesthesia Transfer of Care Note  Patient: Timothy Brown  Procedure(s) Performed: Procedure(s): COLONOSCOPY WITH PROPOFOL (N/A)  Patient Location: PACU  Anesthesia Type:General  Level of Consciousness: sedated  Airway & Oxygen Therapy: Patient Spontanous Breathing and Patient connected to nasal cannula oxygen  Post-op Assessment: Report given to RN and Post -op Vital signs reviewed and stable  Post vital signs: Reviewed and stable  Last Vitals:  Vitals:   06/21/16 0932 06/21/16 1020  BP:  (!) 102/49  Pulse: 69 78  Resp: 18 14  Temp: (!) 35.4 C (!) 36 C    Last Pain:  Vitals:   06/21/16 1020  TempSrc: Tympanic         Complications: No apparent anesthesia complications

## 2016-06-21 NOTE — Anesthesia Preprocedure Evaluation (Addendum)
Anesthesia Evaluation  Patient identified by MRN, date of birth, ID band Patient awake    Reviewed: Allergy & Precautions, NPO status , Patient's Chart, lab work & pertinent test results  Airway Mallampati: II       Dental no notable dental hx. (+) Chipped   Pulmonary shortness of breath and with exertion,    Pulmonary exam normal        Cardiovascular negative cardio ROS Normal cardiovascular exam     Neuro/Psych negative neurological ROS  negative psych ROS   GI/Hepatic Neg liver ROS, GERD  ,  Endo/Other  negative endocrine ROS  Renal/GU negative Renal ROS  negative genitourinary   Musculoskeletal negative musculoskeletal ROS (+)   Abdominal Normal abdominal exam  (+)   Peds negative pediatric ROS (+)  Hematology negative hematology ROS (+)   Anesthesia Other Findings Past Medical History: No date: GERD (gastroesophageal reflux disease)     Comment: TUMS No date: Gout No date: Shortness of breath     Comment: WITH EXERTION  Reproductive/Obstetrics                            Anesthesia Physical Anesthesia Plan  ASA: II  Anesthesia Plan: General   Post-op Pain Management:    Induction: Intravenous  Airway Management Planned: Nasal Cannula  Additional Equipment:   Intra-op Plan:   Post-operative Plan:   Informed Consent: I have reviewed the patients History and Physical, chart, labs and discussed the procedure including the risks, benefits and alternatives for the proposed anesthesia with the patient or authorized representative who has indicated his/her understanding and acceptance.   Dental advisory given  Plan Discussed with: CRNA and Surgeon  Anesthesia Plan Comments:         Anesthesia Quick Evaluation

## 2016-06-21 NOTE — H&P (Signed)
Timothy Bellows MD [address], [place], [place] [address] Phone: [phone] Fax : [phone]  Primary Care Physician:  Timothy Athens, MD Primary Gastroenterologist:  Dr. Jonathon Brown   Pre-Procedure History & Physical: HPI:  Timothy Brown is a 56 y.o. male is here for an colonoscopy.   Past Medical History:  Diagnosis Date  . GERD (gastroesophageal reflux disease)    TUMS  . Gout   . Shortness of breath    WITH EXERTION    Past Surgical History:  Procedure Laterality Date  . ANTERIOR CERVICAL DECOMP/DISCECTOMY FUSION N/A 05/19/2012   Procedure: ANTERIOR CERVICAL DECOMPRESSION/DISCECTOMY FUSION 2 LEVELS;  Surgeon: Ophelia Charter, MD;  Location: [facility];  Service: Neurosurgery;  Laterality: N/A;  C56 C67 anterior cervical decompression with fusion interbody prothesis plating and bonegraft  . BREAST LUMPECTOMY Left    LUMP ON NIPPLE @ 56 YRS OLD    Prior to Admission medications   Medication Sig Start Date End Date Taking? Authorizing Provider  allopurinol (ZYLOPRIM) 300 MG tablet Take 300 mg by mouth daily.    Historical Provider, MD  cholecalciferol (VITAMIN D) 1000 UNITS tablet Take 1,000 Units by mouth daily.    Historical Provider, MD  diazepam (VALIUM) 5 MG tablet Take 1 tablet (5 mg total) by mouth every 6 (six) hours as needed. 05/20/12   Newman Pies, MD  docusate sodium 100 MG CAPS Take 100 mg by mouth 2 (two) times daily. 05/20/12   Newman Pies, MD  Multiple Vitamins-Minerals (MULTIVITAMINS THER. W/MINERALS) TABS Take 1 tablet by mouth daily.    Historical Provider, MD  oxyCODONE-acetaminophen (PERCOCET) 10-325 MG per tablet Take 1 tablet by mouth every 4 (four) hours as needed for pain. 05/20/12   Newman Pies, MD  vitamin C (ASCORBIC ACID) 500 MG tablet Take 500 mg by mouth daily.    Historical Provider, MD    Allergies as of 06/07/2016  . (No Known Allergies)    History reviewed. No pertinent family history.  Social History   Social  History  . Marital status: Single    Spouse name: N/A  . Number of children: N/A  . Years of education: N/A   Occupational History  . Not on file.   Social History Main Topics  . Smoking status: Never Smoker  . Smokeless tobacco: Not on file  . Alcohol use 0.0 oz/week    4 - 6 Cans of beer per week  . Drug use: Yes    Types: Marijuana     Comment: 40 YRS AGO  . Sexual activity: Not on file   Other Topics Concern  . Not on file   Social History Narrative  . No narrative on file    Review of Systems: See HPI, otherwise negative ROS  Physical Exam: There were no vitals taken for this visit. General:   Alert,  pleasant and cooperative in NAD Head:  Normocephalic and atraumatic. Neck:  Supple; no masses or thyromegaly. Lungs:  Clear throughout to auscultation.    Heart:  Regular rate and rhythm. Abdomen:  Soft, nontender and nondistended. Normal bowel sounds, without guarding, and without rebound.   Neurologic:  Alert and  oriented x4;  grossly normal neurologically.  Impression/Plan: Timothy Brown is here for an colonoscopy to be performed for surveillance due to prior history of colon polyps   Risks, benefits, limitations, and alternatives regarding  colonoscopy have been reviewed with the patient.  Questions have been answered.  All parties agreeable.   Bailey Mech  Vicente Males, MD  06/21/2016, 9:25 AM

## 2016-06-21 NOTE — Anesthesia Postprocedure Evaluation (Signed)
Anesthesia Post Note  Patient: Timothy Brown  Procedure(s) Performed: Procedure(s) (LRB): COLONOSCOPY WITH PROPOFOL (N/A)  Patient location during evaluation: PACU Anesthesia Type: General Level of consciousness: awake and alert and oriented Pain management: pain level controlled Vital Signs Assessment: post-procedure vital signs reviewed and stable Respiratory status: spontaneous breathing Cardiovascular status: blood pressure returned to baseline Anesthetic complications: no     Last Vitals:  Vitals:   06/21/16 1050 06/21/16 1100  BP: 109/64 116/68  Pulse: 65 67  Resp: 14 14  Temp:      Last Pain:  Vitals:   06/21/16 1020  TempSrc: Tympanic                 Silvana Holecek

## 2016-06-21 NOTE — Op Note (Signed)
[facility]astro[facility] Patient Name: Timothy Brown Procedure Date: 06/21/2016 9:18 AM MRN: [id number] Account #: [id number] Date of Birth: [birth year] Admit Type: Outpatient Age: 56 Room: [facility] ENDO ROOM 4 Gender: Male Note Status: Finalized Procedure:            Colonoscopy Indications:          High risk colon cancer surveillance: Personal history                        of colonic polyps, Last colonoscopy: December 2015 Providers:            Jonathon Bellows MD, MD Referring MD:         Cletis Athens, MD (Referring MD) Medicines:            Monitored Anesthesia Care Complications:        No immediate complications. Procedure:            Pre-Anesthesia Assessment:                       - Prior to the procedure, a History and Physical was                        performed, and patient medications, allergies and                        sensitivities were reviewed. The patient's tolerance of                        previous anesthesia was reviewed.                       - The risks and benefits of the procedure and the                        sedation options and risks were discussed with the                        patient. All questions were answered and informed                        consent was obtained.                       - ASA Grade Assessment: II - A patient with mild                        systemic disease.                       After obtaining informed consent, the colonoscope was                        passed under direct vision. Throughout the procedure,                        the patient's blood pressure, pulse, and oxygen                        saturations were monitored continuously. The  Colonoscope was introduced through the anus and                        advanced to the the cecum, identified by the                        appendiceal orifice, IC valve and transillumination.                        The colonoscopy was performed with  ease. The patient                        tolerated the procedure well. The quality of the bowel                        preparation was excellent. Findings:      The perianal and digital rectal examinations were normal.      A 3 mm polyp was found in the ascending colon. The polyp was sessile.       The polyp was removed with a cold biopsy forceps. Resection and       retrieval were complete.      Non-bleeding internal hemorrhoids were found during retroflexion. The       hemorrhoids were small and Grade I (internal hemorrhoids that do not       prolapse).      Multiple small-mouthed diverticula were found in the entire colon.      The exam was otherwise without abnormality on direct and retroflexion       views. Impression:           - One 3 mm polyp in the ascending colon, removed with a                        cold biopsy forceps. Resected and retrieved.                       - Non-bleeding internal hemorrhoids.                       - Diverticulosis in the entire examined colon.                       - The examination was otherwise normal on direct and                        retroflexion views. Recommendation:       - Discharge patient to home (with escort).                       - Resume previous diet.                       - Continue present medications.                       - Await pathology results.                       - Repeat colonoscopy in 5 years for surveillance. Procedure Code(s):    --- Professional ---  45380, Colonoscopy, flexible; with biopsy, single or                        multiple Diagnosis Code(s):    --- Professional ---                       D12.2, Benign neoplasm of ascending colon                       Z86.010, Personal history of colonic polyps                       K64.0, First degree hemorrhoids                       K57.30, Diverticulosis of large intestine without                        perforation or abscess without bleeding CPT  copyright 2016 American Medical Association. All rights reserved. The codes documented in this report are preliminary and upon coder review may  be revised to meet current compliance requirements. Jonathon Bellows, MD Jonathon Bellows MD, MD 06/21/2016 10:19:53 AM This report has been signed electronically. Number of Addenda: 0 Note Initiated On: 06/21/2016 9:18 AM Total Procedure Duration: 0 hours 21 minutes 17 seconds       [facility]

## 2016-06-22 ENCOUNTER — Encounter: Payer: Self-pay | Admitting: Gastroenterology

## 2016-06-22 LAB — SURGICAL PATHOLOGY

## 2019-06-14 ENCOUNTER — Encounter: Payer: Self-pay | Admitting: Cardiovascular Disease

## 2019-06-14 NOTE — Progress Notes (Signed)
Cardiology Office Note:    Date:  06/15/2019   ID:  Timothy Brown, DOB 01-24-1961, MRN [id number]  PCP:  Cletis Athens, MD  Cardiologist:  Caelen Reierson  Electrophysiologist:  None   Referring MD: Cletis Athens, MD   Chief Complaint  Patient presents with  . Hypertension    History of Present Illness:    Timothy Brown is a 59 y.o. male with a hx of HTN.  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 No regular exercise  Girl friend is Management consultant.  Has lost 80 lbs in the past.   Eats meat and potatoes .   Eats a high salt diet .   Non smoker  Drinks hard seltzer  6-7 a night.  - under lots of stress    Eats lost of salt   No CP ,   No dyspnea   Past Medical History:  Diagnosis Date  . GERD (gastroesophageal reflux disease)    TUMS  . Gout   . Shortness of breath    WITH EXERTION    Past Surgical History:  Procedure Laterality Date  . ANTERIOR CERVICAL DECOMP/DISCECTOMY FUSION N/A 05/19/2012   Procedure: ANTERIOR CERVICAL DECOMPRESSION/DISCECTOMY FUSION 2 LEVELS;  Surgeon: Ophelia Charter, MD;  Location: [facility];  Service: Neurosurgery;  Laterality: N/A;  C56 C67 anterior cervical decompression with fusion interbody prothesis plating and bonegraft  . BREAST LUMPECTOMY Left    LUMP ON NIPPLE @ 59 YRS OLD  . COLONOSCOPY WITH PROPOFOL N/A 06/21/2016   Procedure: COLONOSCOPY WITH PROPOFOL;  Surgeon: Jonathon Bellows, MD;  Location: [facility] ENDOSCOPY;  Service: Endoscopy;  Laterality: N/A;    Current Medications: Current Meds  Medication Sig  . aspirin EC 81 MG tablet Take 81 mg by mouth daily.  . cholecalciferol (VITAMIN D) 1000 UNITS tablet Take 1,000 Units by mouth daily.  . diazepam (VALIUM) 5 MG tablet Take 1 tablet (5 mg total) by mouth every 6 (six) hours as needed.  Marland Kitchen losartan (COZAAR) 50 MG tablet Take 50 mg by mouth daily.  . sildenafil (VIAGRA) 100 MG tablet Take 100 mg by mouth as  directed.  . vitamin C (ASCORBIC ACID) 500 MG tablet Take 500 mg by mouth daily.     Allergies:   Patient has no known allergies.   Social History   Socioeconomic History  . Marital status: Single    Spouse name: Not on file  . Number of children: Not on file  . Years of education: Not on file  . Highest education level: Not on file  Occupational History  . Not on file  Tobacco Use  . Smoking status: Never Smoker  . Smokeless tobacco: Never Used  Substance and Sexual Activity  . Alcohol use: Yes    Alcohol/week: 4.0 - 6.0 standard drinks    Types: 4 - 6 Cans of beer per week  . Drug use: Yes    Types: Marijuana    Comment: 40 YRS AGO  . Sexual activity: Not on file  Other Topics Concern  . Not on file  Social History Narrative  . Not on file   Social Determinants of Health   Financial Resource Strain:   . Difficulty of Paying Living Expenses:   Food Insecurity:   . Worried About Charity fundraiser in the Last Year:   . [place] in the Last Year:  Transportation Needs:   . Film/video editor (Medical):   Marland Kitchen Lack of Transportation (Non-Medical):   Physical Activity:   . Days of Exercise per Week:   . Minutes of Exercise per Session:   Stress:   . Feeling of Stress :   Social Connections:   . Frequency of Communication with Friends and Family:   . Frequency of Social Gatherings with Friends and Family:   . Attends Religious Services:   . Active Member of Clubs or Organizations:   . Attends Archivist Meetings:   Marland Kitchen Marital Status:      Family History: The patient's family history includes Lung cancer in his father and mother.  ROS:   Please see the history of present illness.      All other systems reviewed and are negative.  EKGs/Labs/Other Studies Reviewed:    The following studies were reviewed today:  EKG:  June 15, 2019:    NSR ,  No ST / T abn.   Recent Labs: No results found for requested labs within last 8760 hours.    Recent Lipid Panel No results found for: CHOL, TRIG, HDL, CHOLHDL, VLDL, LDLCALC, LDLDIRECT  Physical Exam:    VS:  BP 140/82   Pulse 61   Ht 6' (1.829 m)   Wt 258 lb (117 kg)   SpO2 97%   BMI 34.99 kg/m     Wt Readings from Last 3 Encounters:  06/15/19 258 lb (117 kg)  06/21/16 250 lb (113.4 kg)  05/12/12 (!) 304 lb 6.4 oz (138.1 kg)     GEN:  Well nourished, well developed in no acute distress HEENT: Normal NECK: No JVD; No carotid bruits LYMPHATICS: No lymphadenopathy CARDIAC: RRR, no murmurs, rubs, gallops RESPIRATORY:  Clear to auscultation without rales, wheezing or rhonchi  ABDOMEN: Soft, non-tender, non-distended MUSCULOSKELETAL:  No edema; No deformity  SKIN: Warm and dry NEUROLOGIC:  Alert and oriented x 3 PSYCHIATRIC:  Normal affect   ASSESSMENT:    1. Essential hypertension    PLAN:    In order of problems listed above:  1. Hypertension-he is not getting any exercise.  He still eats a fairly high salt diet and he drinks 6-7 hard seltzers at night.  I have asked him to greatly reduce his intake of the hard seltzers.  He needs to greatly reduce his salt intake and have encouraged him to exercise at least an hour a day 3-4 times a week.  He works 12-hour days.  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  We will need to find some time to exercise for his own health.  We discussed starting him on HCTZ and potassium but he would rather see if he can work on weight loss and salt restriction.  I will see him in 3 months.  If he has been able to make progress then we will not have to start additional medications.  Otherwise we may be adding new medications.  He is not having any angina.  I do not think that he needs any additional testing at this time.     Medication Adjustments/Labs and Tests Ordered: Current medicines are reviewed at length with the patient today.  Concerns  regarding medicines are outlined above.  Orders Placed This Encounter  Procedures  . EKG 12-Lead   No orders of the defined types were placed in this encounter.   Patient Instructions  Medication Instructions:  Your physician recommends that you continue on your current medications as directed. Please refer to the Current Medication list given to you today.  *If you need a refill on your cardiac medications before your next appointment, please call your pharmacy*   Lab Work: None Ordered If you have labs (blood work) drawn today and your tests are completely normal, you will receive your results only by: Marland Kitchen MyChart Message (if you have MyChart) OR . A paper copy in the mail If you have any lab test that is abnormal or we need to change your treatment, we will call you to review the results.   Testing/Procedures: None Ordered   Follow-Up: At [facility], you and your health needs are our priority.  As part of our continuing mission to provide you with exceptional heart care, we have created designated Provider Care Teams.  These Care Teams include your primary Cardiologist (physician) and Advanced Practice Providers (APPs -  Physician Assistants and Nurse Practitioners) who all work together to provide you with the care you need, when you need it.   Your next appointment:   3 month(s)  The format for your next appointment:   In Person  Provider:   You may see Mertie Moores, MD or one of the following Advanced Practice Providers on your designated Care Team:    Richardson Dopp, PA-C  [place], [place]  Daune Perch, [place]        Signed, Mertie Moores, MD  06/15/2019 5:58 PM    [place]

## 2019-06-15 ENCOUNTER — Ambulatory Visit: Payer: BLUE CROSS/BLUE SHIELD | Admitting: Cardiovascular Disease

## 2019-06-15 ENCOUNTER — Other Ambulatory Visit: Payer: Self-pay

## 2019-06-15 ENCOUNTER — Encounter: Payer: Self-pay | Admitting: Cardiovascular Disease

## 2019-06-15 VITALS — BP 140/82 | HR 61 | Ht 72.0 in | Wt 258.0 lb

## 2019-06-15 DIAGNOSIS — I10 Essential (primary) hypertension: Secondary | ICD-10-CM | POA: Diagnosis not present

## 2019-06-15 NOTE — Patient Instructions (Addendum)
Medication Instructions:  Your physician recommends that you continue on your current medications as directed. Please refer to the Current Medication list given to you today.  *If you need a refill on your cardiac medications before your next appointment, please call your pharmacy*   Lab Work: None Ordered If you have labs (blood work) drawn today and your tests are completely normal, you will receive your results only by: Marland Kitchen MyChart Message (if you have MyChart) OR . A paper copy in the mail If you have any lab test that is abnormal or we need to change your treatment, we will call you to review the results.   Testing/Procedures: None Ordered   Follow-Up: At [facility], you and your health needs are our priority.  As part of our continuing mission to provide you with exceptional heart care, we have created designated Provider Care Teams.  These Care Teams include your primary Cardiologist (physician) and Advanced Practice Providers (APPs -  Physician Assistants and Nurse Practitioners) who all work together to provide you with the care you need, when you need it.   Your next appointment:   3 month(s)  The format for your next appointment:   In Person  Provider:   You may see Mertie Moores, MD or one of the following Advanced Practice Providers on your designated Care Team:    Richardson Dopp, PA-C  [place], [place]  Daune Perch, [place]

## 2019-08-28 ENCOUNTER — Ambulatory Visit: Payer: BLUE CROSS/BLUE SHIELD | Admitting: Internal Medicine

## 2019-08-31 ENCOUNTER — Ambulatory Visit: Payer: BLUE CROSS/BLUE SHIELD | Admitting: Internal Medicine

## 2019-09-08 ENCOUNTER — Ambulatory Visit (INDEPENDENT_AMBULATORY_CARE_PROVIDER_SITE_OTHER): Payer: BLUE CROSS/BLUE SHIELD | Admitting: Internal Medicine

## 2019-09-08 ENCOUNTER — Encounter: Payer: Self-pay | Admitting: Internal Medicine

## 2019-09-08 ENCOUNTER — Other Ambulatory Visit: Payer: Self-pay

## 2019-09-08 VITALS — BP 155/92 | HR 72 | Ht 72.0 in | Wt 260.0 lb

## 2019-09-08 DIAGNOSIS — R1319 Other dysphagia: Secondary | ICD-10-CM

## 2019-09-08 DIAGNOSIS — E6609 Other obesity due to excess calories: Secondary | ICD-10-CM

## 2019-09-08 DIAGNOSIS — I10 Essential (primary) hypertension: Secondary | ICD-10-CM | POA: Diagnosis not present

## 2019-09-08 DIAGNOSIS — E66812 Obesity, class 2: Secondary | ICD-10-CM | POA: Insufficient documentation

## 2019-09-08 DIAGNOSIS — F419 Anxiety disorder, unspecified: Secondary | ICD-10-CM

## 2019-09-08 DIAGNOSIS — R1310 Dysphagia, unspecified: Secondary | ICD-10-CM | POA: Diagnosis not present

## 2019-09-08 DIAGNOSIS — Z6835 Body mass index (BMI) 35.0-35.9, adult: Secondary | ICD-10-CM

## 2019-09-08 MED ORDER — DIAZEPAM 5 MG PO TABS: 5.0000 mg | ORAL_TABLET | Freq: Four times a day (QID) | ORAL | 1 refills | Status: DC | PRN

## 2019-09-08 MED ORDER — LOSARTAN POTASSIUM 50 MG PO TABS: 50.0000 mg | ORAL_TABLET | Freq: Every day | ORAL | 2 refills | Status: DC

## 2019-09-08 NOTE — Assessment & Plan Note (Signed)
-   Today, the patient's blood pressure is well managed on cozaar - The patient will continue the current treatment regimen.  - I encouraged the patient to eat a low-sodium diet to help control blood pressure. - I encouraged the patient to live an active lifestyle and complete activities that increases heart rate to 85% target heart rate at least 5 times per week for one hour.

## 2019-09-08 NOTE — Assessment & Plan Note (Signed)
Patient is losing weight have  treadmill at home.

## 2019-09-08 NOTE — Assessment & Plan Note (Signed)
-   Patient experiencing high levels of anxiety.  - Encouraged patient to engage in relaxing activities like yoga, meditation, journaling, going for a walk, or participating in a hobby.  - Encouraged patient to reach out to trusted friends or family members about recent struggles 

## 2019-09-08 NOTE — Assessment & Plan Note (Signed)
patient got stuck on the lower esophagus with a piece of steak. it lasted for 3 hours     Finally able to get rid of dysphagia.  He has a history of cancer of the esophagus   In family so I will send him to GI specialist for endoscopy.

## 2019-09-08 NOTE — Patient Instructions (Signed)
Dysphagia  Dysphagia is trouble swallowing. This condition occurs when solids and liquids stick in a person's throat on the way down to the stomach, or when food takes longer to get to the stomach than usual. You may have problems swallowing food, liquids, or both. You may also have pain while trying to swallow. It may take you more time and effort to swallow something. What are the causes? This condition may be caused by:  Muscle problems. They may make it difficult for you to move food and liquids through the esophagus, which is the tube that connects your mouth to your stomach.  Blockages. You may have ulcers, scar tissue, or inflammation that blocks the normal passage of food and liquids. Causes of these problems include: ? Acid reflux from your stomach into your esophagus (gastroesophageal reflux). ? Infections. ? Radiation treatment for cancer. ? Medicines taken without enough fluids to wash them down into your stomach.  Stroke. This can affect the nerves and make it difficult to swallow.  Nerve problems. These prevent signals from being sent to the muscles of your esophagus to squeeze (contract) and move what you swallow down to your stomach.  Globus pharyngeus. This is a common problem that involves a feeling like something is stuck in your throat or a sense of trouble with swallowing, even though nothing is wrong with the swallowing passages.  Certain conditions, such as cerebral palsy or Parkinson's disease. What are the signs or symptoms? Common symptoms of this condition include:  A feeling that solids or liquids are stuck in your throat on the way down to the stomach.  Pain while swallowing.  Coughing or gagging while trying to swallow. Other symptoms include:  Food moving back from your stomach to your mouth (regurgitation).  Noises coming from your throat.  Chest discomfort with swallowing.  A feeling of fullness when swallowing.  Drooling, especially when the  throat is blocked.  Heartburn. How is this diagnosed? This condition may be diagnosed by:  Barium X-ray. In this test, you will swallow a white liquid that sticks to the inside of your esophagus. X-ray images are then taken.  Endoscopy. In this test, a flexible telescope is inserted down your throat to look at your esophagus and your stomach.  CT scans and an MRI. How is this treated? Treatment for dysphagia depends on the cause of this condition, such as:  If the dysphagia is caused by acid reflux or infection, medicines may be used. They may include antibiotics and heartburn medicines.  If the dysphagia is caused by problems with the muscles, swallowing therapy may be used to help you strengthen your swallowing muscles. You may have to do specific exercises to strengthen the muscles or stretch them.  If the dysphagia is caused by a blockage or mass, procedures to remove the blockage may be done. You may need surgery and a feeding tube. You may need to make diet changes. Ask your health care provider for specific instructions. Follow these instructions at home: Medicines  Take over-the-counter and prescription medicines only as told by your health care provider.  If you were prescribed an antibiotic medicine, take it as told by your health care provider. Do not stop taking the antibiotic even if you start to feel better. Eating and drinking   Follow any diet changes as told by your health care provider.  Work with a diet and nutrition specialist (dietitian) to create an eating plan that will help you get the nutrients you need in   order to stay healthy.  Eat soft foods that are easier to swallow.  Cut your food into small pieces and eat slowly. Take small bites.  Eat and drink only when you are sitting upright.  Do not drink alcohol or caffeine. If you need help quitting, ask your health care provider. General instructions  Check your weight every day to make sure you are  not losing weight.  Do not use any products that contain nicotine or tobacco, such as cigarettes, e-cigarettes, and chewing tobacco. If you need help quitting, ask your health care provider.  Keep all follow-up visits as told by your health care provider. This is important. Contact a health care provider if you:  Lose weight because you cannot swallow.  Cough when you drink liquids.  Cough up partially digested food. Get help right away if you:  Cannot swallow your saliva.  Have shortness of breath, a fever, or both.  Have a hoarse voice and also have trouble swallowing. Summary  Dysphagia is trouble swallowing. This condition occurs when solids and liquids stick in a person's throat on the way down to the stomach. You may cough or gag while trying to swallow.  Dysphagia has many possible causes.  Treatment for dysphagia depends on the cause of the condition.  Keep all follow-up visits as told by your health care provider. This is important. This information is not intended to replace advice given to you by your health care provider. Make sure you discuss any questions you have with your health care provider. Document Revised: 07/16/2018 Document Reviewed: 07/16/2018 Elsevier Patient Education  2020 [employer]  

## 2019-09-08 NOTE — Progress Notes (Signed)
Established Patient Office Visit  SUBJECTIVE:  Subjective  Patient ID: Timothy Brown, male    DOB: August 28, 1960  Age: 59 y.o. MRN: [id number]  CC:  Chief Complaint  Patient presents with  . Dysphagia    Patient complains of things getting stuck in his throat    HPI Timothy Brown is a 59 y.o. male presenting today for an evaluation of his dysphagia.   He notes that he was at a family dinner and he got a piece of steak caught in his esophagus for three hours. During this time period he was unable to eat or drink anything. He has been having this issue for the last 10 years, but it has recently worsened with longer and more frequent episodes. He notes that both his mother and his grandmother have dysphagia.   He tries to cut his food up into very small pieces and chews his food well, but sometimes food slips past this process.  He notes that he has recently lost weight; he started a new diet and exercise routine to help this process. His bowel movements have been normal.    Past Medical History:  Diagnosis Date  . GERD (gastroesophageal reflux disease)    TUMS  . Gout   . Shortness of breath    WITH EXERTION    Past Surgical History:  Procedure Laterality Date  . ANTERIOR CERVICAL DECOMP/DISCECTOMY FUSION N/A 05/19/2012   Procedure: ANTERIOR CERVICAL DECOMPRESSION/DISCECTOMY FUSION 2 LEVELS;  Surgeon: Ophelia Charter, MD;  Location: [facility];  Service: Neurosurgery;  Laterality: N/A;  C56 C67 anterior cervical decompression with fusion interbody prothesis plating and bonegraft  . BREAST LUMPECTOMY Left    LUMP ON NIPPLE @ 59 YRS OLD  . COLONOSCOPY WITH PROPOFOL N/A 06/21/2016   Procedure: COLONOSCOPY WITH PROPOFOL;  Surgeon: Jonathon Bellows, MD;  Location: [facility];  Service: Endoscopy;  Laterality: N/A;    Family History  Problem Relation Age of Onset  . Lung cancer Mother   . Lung cancer Father     Social History   Socioeconomic History  . Marital status: Single     Spouse name: Not on file  . Number of children: Not on file  . Years of education: Not on file  . Highest education level: Not on file  Occupational History  . Not on file  Tobacco Use  . Smoking status: Never Smoker  . Smokeless tobacco: Never Used  Substance and Sexual Activity  . Alcohol use: Yes    Alcohol/week: 4.0 - 6.0 standard drinks    Types: 4 - 6 Cans of beer per week  . Drug use: Yes    Types: Marijuana    Comment: 40 YRS AGO  . Sexual activity: Not on file  Other Topics Concern  . Not on file  Social History Narrative  . Not on file   Social Determinants of Health   Financial Resource Strain:   . Difficulty of Paying Living Expenses:   Food Insecurity:   . Worried About Charity fundraiser in the Last Year:   . Arboriculturist in the Last Year:   Transportation Needs:   . Film/video editor (Medical):   Marland Kitchen Lack of Transportation (Non-Medical):   Physical Activity:   . Days of Exercise per Week:   . Minutes of Exercise per Session:   Stress:   . Feeling of Stress :   Social Connections:   . Frequency of Communication with Friends and  Family:   . Frequency of Social Gatherings with Friends and Family:   . Attends Religious Services:   . Active Member of Clubs or Organizations:   . Attends Archivist Meetings:   Marland Kitchen Marital Status:   Intimate Partner Violence:   . Fear of Current or Ex-Partner:   . Emotionally Abused:   Marland Kitchen Physically Abused:   . Sexually Abused:      Current Outpatient Medications:  .  aspirin EC 81 MG tablet, Take 81 mg by mouth daily., Disp: , Rfl:  .  cholecalciferol (VITAMIN D) 1000 UNITS tablet, Take 1,000 Units by mouth daily., Disp: , Rfl:  .  diazepam (VALIUM) 5 MG tablet, Take 1 tablet (5 mg total) by mouth every 6 (six) hours as needed., Disp: 50 tablet, Rfl: 1 .  losartan (COZAAR) 50 MG tablet, Take 1 tablet (50 mg total) by mouth daily., Disp: 90 tablet, Rfl: 2 .  sildenafil (VIAGRA) 100 MG tablet, Take 100  mg by mouth as directed., Disp: , Rfl:  .  vitamin C (ASCORBIC ACID) 500 MG tablet, Take 500 mg by mouth daily., Disp: , Rfl:    No Known Allergies  ROS Review of Systems  Constitutional: Negative.  Negative for unexpected weight change.  HENT: Positive for trouble swallowing.   Eyes: Negative.   Respiratory: Positive for choking and chest tightness (during dysphagia).   Cardiovascular: Negative.  Negative for chest pain and palpitations.  Gastrointestinal: Positive for vomiting. Negative for blood in stool, constipation, diarrhea and nausea.  Endocrine: Negative.   Genitourinary: Negative.   Musculoskeletal: Negative.   Skin: Negative.   Allergic/Immunologic: Negative.   Neurological: Negative.   Hematological: Negative.   Psychiatric/Behavioral: Negative.   All other systems reviewed and are negative.    OBJECTIVE:    Physical Exam Vitals reviewed.  Constitutional:      Appearance: Normal appearance.  Neck:     Vascular: No carotid bruit.  Cardiovascular:     Rate and Rhythm: Normal rate and regular rhythm.     Pulses: Normal pulses.     Heart sounds: Normal heart sounds.  Pulmonary:     Effort: Pulmonary effort is normal.     Breath sounds: Normal breath sounds.  Abdominal:     General: Bowel sounds are normal.     Palpations: Abdomen is soft. There is no hepatomegaly or splenomegaly.     Tenderness: There is no abdominal tenderness.     Hernia: No hernia is present.  Musculoskeletal:     Right lower leg: No edema.     Left lower leg: No edema.  Skin:    Findings: No rash.  Neurological:     Mental Status: He is alert and oriented to person, place, and time.  Psychiatric:        Mood and Affect: Mood normal.        Behavior: Behavior normal.     BP (!) 155/92   Pulse 72   Ht 6' (1.829 m)   Wt 260 lb (117.9 kg)   SpO2 98%   BMI 35.26 kg/m  Wt Readings from Last 3 Encounters:  09/08/19 260 lb (117.9 kg)  06/15/19 258 lb (117 kg)  06/21/16 250 lb  (113.4 kg)    Health Maintenance Due  Topic Date Due  . Hepatitis C Screening  Never done  . COVID-19 Vaccine (1) Never done  . HIV Screening  Never done  . TETANUS/TDAP  Never done    There are no  preventive care reminders to display for this patient.  No flowsheet data found. No flowsheet data found.  No results found for: TSH No results found for: ALBUMIN, ANIONGAP, EGFR, GFR No results found for: CHOL, HDL, LDLCALC, CHOLHDL No results found for: TRIG No results found for: HGBA1C    ASSESSMENT & PLAN:   Problem List Items Addressed This Visit      Cardiovascular and Mediastinum   HTN (hypertension)    - Today, the patient's blood pressure is well managed on cozaar - The patient will continue the current treatment regimen.  - I encouraged the patient to eat a low-sodium diet to help control blood pressure. - I encouraged the patient to live an active lifestyle and complete activities that increases heart rate to 85% target heart rate at least 5 times per week for one hour.          Relevant Medications   losartan (COZAAR) 50 MG tablet     Digestive   Esophageal dysphagia - Primary    patient got stuck on the lower esophagus with a piece of steak. it lasted for 3 hours     Finally able to get rid of dysphagia.  He has a history of cancer of the esophagus   In family so I will send him to GI specialist for endoscopy.        Other   Anxiety    - Patient experiencing high levels of anxiety.  - Encouraged patient to engage in relaxing activities like yoga, meditation, journaling, going for a walk, or participating in a hobby.  - Encouraged patient to reach out to trusted friends or family members about recent struggles       Relevant Medications   diazepam (VALIUM) 5 MG tablet   Class 2 obesity due to excess calories without serious comorbidity with body mass index (BMI) of 35.0 to 35.9 in adult    Patient is losing weight have  treadmill at home.          Meds ordered this encounter  Medications  . losartan (COZAAR) 50 MG tablet    Sig: Take 1 tablet (50 mg total) by mouth daily.    Dispense:  90 tablet    Refill:  2  . diazepam (VALIUM) 5 MG tablet    Sig: Take 1 tablet (5 mg total) by mouth every 6 (six) hours as needed.    Dispense:  50 tablet    Refill:  1     Follow-up: Return in about 4 weeks (around 10/06/2019).    Dr. Jane Canary [facility] [address] [place], [place] [address]   By signing my name below, I, [employer], attest that this documentation has been prepared under the direction and in the presence of Cletis Athens, MD. Electronically Signed: Cletis Athens, MD 09/08/19, 4:26 PM   I personally performed the services described in this documentation, which was SCRIBED in my presence. The recorded information has been reviewed and considered accurate. It has been edited as necessary during review. Cletis Athens, MD

## 2019-09-09 ENCOUNTER — Other Ambulatory Visit: Payer: Self-pay | Admitting: *Deleted

## 2019-10-11 ENCOUNTER — Encounter: Payer: Self-pay | Admitting: Emergency Medicine

## 2019-10-11 ENCOUNTER — Other Ambulatory Visit: Payer: Self-pay

## 2019-10-11 ENCOUNTER — Emergency Department
Admission: EM | Admit: 2019-10-11 | Discharge: 2019-10-11 | Disposition: A | Discharge status: Home / Self Care | Payer: BLUE CROSS/BLUE SHIELD | Attending: Emergency Medicine | Admitting: Emergency Medicine

## 2019-10-11 ENCOUNTER — Emergency Department: Payer: BLUE CROSS/BLUE SHIELD

## 2019-10-11 DIAGNOSIS — Z7982 Long term (current) use of aspirin: Secondary | ICD-10-CM | POA: Insufficient documentation

## 2019-10-11 DIAGNOSIS — Z79899 Other long term (current) drug therapy: Secondary | ICD-10-CM | POA: Diagnosis not present

## 2019-10-11 DIAGNOSIS — R109 Unspecified abdominal pain: Secondary | ICD-10-CM | POA: Insufficient documentation

## 2019-10-11 DIAGNOSIS — M549 Dorsalgia, unspecified: Secondary | ICD-10-CM | POA: Insufficient documentation

## 2019-10-11 DIAGNOSIS — I10 Essential (primary) hypertension: Secondary | ICD-10-CM | POA: Insufficient documentation

## 2019-10-11 LAB — URINALYSIS, COMPLETE (UACMP) WITH MICROSCOPIC
Bacteria, UA: NONE SEEN
Bilirubin Urine: NEGATIVE
Glucose, UA: NEGATIVE mg/dL
Hgb urine dipstick: NEGATIVE
Ketones, ur: NEGATIVE mg/dL
Leukocytes,Ua: NEGATIVE
Nitrite: NEGATIVE
Protein, ur: NEGATIVE mg/dL
Specific Gravity, Urine: 1.012 (ref 1.005–1.030)
Squamous Epithelial / LPF: NONE SEEN (ref 0–5)
pH: 6 (ref 5.0–8.0)

## 2019-10-11 MED ORDER — KETOROLAC TROMETHAMINE 30 MG/ML IJ SOLN
30.0000 mg | Freq: Once | INTRAMUSCULAR | Status: AC
  Administered 2019-10-11: 30 mg via INTRAMUSCULAR
  Filled 2019-10-11: qty 1

## 2019-10-11 MED ORDER — LIDOCAINE 5 % EX PTCH
1.0000 | MEDICATED_PATCH | CUTANEOUS | Status: DC
  Administered 2019-10-11: 1 via TRANSDERMAL
  Filled 2019-10-11: qty 1

## 2019-10-11 MED ORDER — CYCLOBENZAPRINE HCL 5 MG PO TABS: ORAL_TABLET | ORAL | 0 refills | Status: DC

## 2019-10-11 MED ORDER — ORPHENADRINE CITRATE 30 MG/ML IJ SOLN
60.0000 mg | Freq: Two times a day (BID) | INTRAMUSCULAR | Status: DC
  Administered 2019-10-11: 60 mg via INTRAMUSCULAR
  Filled 2019-10-11: qty 2

## 2019-10-11 MED ORDER — OXYCODONE-ACETAMINOPHEN 5-325 MG PO TABS
1.0000 | ORAL_TABLET | Freq: Once | ORAL | Status: AC
  Administered 2019-10-11: 1 via ORAL
  Filled 2019-10-11: qty 1

## 2019-10-11 MED ORDER — HYDROCODONE-ACETAMINOPHEN 5-325 MG PO TABS: 2.0000 | ORAL_TABLET | Freq: Four times a day (QID) | ORAL | 0 refills | Status: AC | PRN

## 2019-10-11 MED ORDER — KETOROLAC TROMETHAMINE 10 MG PO TABS: 10.0000 mg | ORAL_TABLET | Freq: Four times a day (QID) | ORAL | 0 refills | Status: DC | PRN

## 2019-10-11 MED ORDER — LIDOCAINE 5 % EX PTCH: 1.0000 | MEDICATED_PATCH | CUTANEOUS | 0 refills | Status: DC

## 2019-10-11 NOTE — ED Provider Notes (Signed)
[facility] Emergency Department Provider Note  ____________________________________________  Time seen: Approximately 4:08 PM  I have reviewed the triage vital signs and the nursing notes.   HISTORY  Chief Complaint Back Pain    HPI Timothy Brown is a 59 y.o. male that presents to the emergency department for evaluation of right mid back pain for 4 days.  Pain started on Wednesday night, improved Thursday and Friday and worsened again yesterday.  Pain is worse with certain movements.  He is not having any pain while sitting still.  Pain is intermittent and when it happens, pain starts in his right mid back and shoots throughout his back.  Patient has been exercising, eating healthy, losing weight for the last 2 months.  No bowel or bladder dysfunction or saddle anesthesias.  No history of kidney stones.  No fevers, nausea, vomiting, abdominal pain, dysuria, hematuria.   Past Medical History:  Diagnosis Date  . GERD (gastroesophageal reflux disease)    TUMS  . Gout   . Shortness of breath    WITH EXERTION    Patient Active Problem List   Diagnosis Date Noted  . Anxiety 09/08/2019  . Esophageal dysphagia 09/08/2019  . Class 2 obesity due to excess calories without serious comorbidity with body mass index (BMI) of 35.0 to 35.9 in adult 09/08/2019  . HTN (hypertension) 06/15/2019    Past Surgical History:  Procedure Laterality Date  . ANTERIOR CERVICAL DECOMP/DISCECTOMY FUSION N/A 05/19/2012   Procedure: ANTERIOR CERVICAL DECOMPRESSION/DISCECTOMY FUSION 2 LEVELS;  Surgeon: Ophelia Charter, MD;  Location: [facility];  Service: Neurosurgery;  Laterality: N/A;  C56 C67 anterior cervical decompression with fusion interbody prothesis plating and bonegraft  . BREAST LUMPECTOMY Left    LUMP ON NIPPLE @ 59 YRS OLD  . COLONOSCOPY WITH PROPOFOL N/A 06/21/2016   Procedure: COLONOSCOPY WITH PROPOFOL;  Surgeon: Jonathon Bellows, MD;  Location: [facility] ENDOSCOPY;  Service:  Endoscopy;  Laterality: N/A;    Prior to Admission medications   Medication Sig Start Date End Date Taking? Authorizing Provider  aspirin EC 81 MG tablet Take 81 mg by mouth daily.    [provider]  cholecalciferol (VITAMIN D) 1000 UNITS tablet Take 1,000 Units by mouth daily.    [provider]  cyclobenzaprine (FLEXERIL) 5 MG tablet Take 1-2 tablets 3 times daily as needed 10/11/19   Laban Emperor, PA-C  diazepam (VALIUM) 5 MG tablet Take 1 tablet (5 mg total) by mouth every 6 (six) hours as needed. 09/08/19   Cletis Athens, MD  HYDROcodone-acetaminophen (NORCO/VICODIN) 5-325 MG tablet Take 2 tablets by mouth every 6 (six) hours as needed for up to 2 days for moderate pain. 10/11/19 10/13/19  Laban Emperor, PA-C  ketorolac (TORADOL) 10 MG tablet Take 1 tablet (10 mg total) by mouth every 6 (six) hours as needed. 10/11/19   Laban Emperor, PA-C  lidocaine (LIDODERM) 5 % Place 1 patch onto the skin daily. Remove & Discard patch within 12 hours or as directed by MD 10/11/19   Laban Emperor, PA-C  losartan (COZAAR) 50 MG tablet Take 1 tablet (50 mg total) by mouth daily. 09/08/19 12/07/19  Cletis Athens, MD  sildenafil (VIAGRA) 100 MG tablet Take 100 mg by mouth as directed. 04/21/19   [provider]  vitamin C (ASCORBIC ACID) 500 MG tablet Take 500 mg by mouth daily.    [provider]    Allergies Patient has no known allergies.  Family History  Problem Relation Age  of Onset  . Lung cancer Mother   . Lung cancer Father     Social History Social History   Tobacco Use  . Smoking status: Never Smoker  . Smokeless tobacco: Never Used  Substance Use Topics  . Alcohol use: Yes    Alcohol/week: 4.0 - 6.0 standard drinks    Types: 4 - 6 Cans of beer per week  . Drug use: Yes    Types: Marijuana    Comment: 40 YRS AGO     Review of Systems  Constitutional: No fever/chills Cardiovascular: No chest pain. Respiratory: No SOB. Gastrointestinal: No abdominal  pain.  No nausea, no vomiting.  Genitourinary: Negative for dysuria. Musculoskeletal: Positive for back pain. Skin: Negative for rash, abrasions, lacerations, ecchymosis. Neurological: Negative for headaches   ____________________________________________   PHYSICAL EXAM:  VITAL SIGNS: ED Triage Vitals  Enc Vitals Group     BP 10/11/19 1223 (!) 145/81     Pulse Rate 10/11/19 1223 (!) 51     Resp 10/11/19 1223 20     Temp 10/11/19 1223 99 F (37.2 C)     Temp Source 10/11/19 1223 Oral     SpO2 10/11/19 1223 97 %     Weight 10/11/19 1224 245 lb (111.1 kg)     Height 10/11/19 1224 6' (1.829 m)     Head Circumference --      Peak Flow --      Pain Score 10/11/19 1223 5     Pain Loc --      Pain Edu? --      Excl. in [place]? --      Constitutional: Alert and oriented. Well appearing and in no acute distress. Eyes: Conjunctivae are normal. PERRL. EOMI. Head: Atraumatic. ENT:      Ears:      Nose: No congestion/rhinnorhea.      Mouth/Throat: Mucous membranes are moist.  Neck: No stridor.   Cardiovascular: Normal rate, regular rhythm.  Good peripheral circulation. Respiratory: Normal respiratory effort without tachypnea or retractions. Lungs CTAB. Good air entry to the bases with no decreased or absent breath sounds. Gastrointestinal: Bowel sounds 4 quadrants. Soft and nontender to palpation. No guarding or rigidity. No palpable masses. No distention.  Tenderness to palpation to right mid back pain. No tenderness to palpation to thoracic or lumbar spine.  Strength equal in lower extremities bilaterally.  Normal gait. Musculoskeletal: Full range of motion to all extremities. No gross deformities appreciated. Neurologic:  Normal speech and language. No gross focal neurologic deficits are appreciated.  Skin:  Skin is warm, dry and intact. No rash noted. Psychiatric: Mood and affect are normal. Speech and behavior are normal. Patient exhibits appropriate insight and  judgement.   ____________________________________________   LABS (all labs ordered are listed, but only abnormal results are displayed)  Labs Reviewed  URINALYSIS, COMPLETE (UACMP) WITH MICROSCOPIC - Abnormal; Notable for the following components:      Result Value   Color, Urine STRAW (*)    APPearance CLEAR (*)    All other components within normal limits   ____________________________________________  EKG   ____________________________________________  RADIOLOGY Robinette Haines, personally viewed and evaluated these images (plain radiographs) as part of my medical decision making, as well as reviewing the written report by the radiologist.  CT Renal Stone Study  Result Date: 10/11/2019 CLINICAL DATA:  Flank pain EXAM: CT ABDOMEN AND PELVIS WITHOUT CONTRAST TECHNIQUE: Multidetector CT imaging of the abdomen and pelvis was performed following the standard protocol  without IV contrast. COMPARISON:  None. FINDINGS: Lower chest: Lung bases are clear. No effusions. Heart is normal size. Hepatobiliary: No focal hepatic abnormality. Gallbladder unremarkable. Pancreas: No focal abnormality or ductal dilatation. Spleen: No focal abnormality.  Normal size. Adrenals/Urinary Tract: No adrenal abnormality. No focal renal abnormality. No stones or hydronephrosis. Urinary bladder is unremarkable. Stomach/Bowel: Colonic diverticulosis. No active diverticulitis. Normal appendix. Stomach and small bowel decompressed, unremarkable. Vascular/Lymphatic: No evidence of aneurysm or adenopathy. Reproductive: No visible focal abnormality. Other: No free fluid or free air. Musculoskeletal: No acute bony abnormality. IMPRESSION: No renal or ureteral stones.  No hydronephrosis. Diffuse colonic diverticulosis.  No active diverticulitis. Electronically Signed   By: Rolm Baptise M.D.   On: 10/11/2019 17:21    ____________________________________________    PROCEDURES  Procedure(s) performed:     Procedures    Medications  orphenadrine (NORFLEX) injection 60 mg (has no administration in time range)  ketorolac (TORADOL) 30 MG/ML injection 30 mg (has no administration in time range)  lidocaine (LIDODERM) 5 % 1 patch (has no administration in time range)  oxyCODONE-acetaminophen (PERCOCET/ROXICET) 5-325 MG per tablet 1 tablet (1 tablet Oral Given 10/11/19 1629)     ____________________________________________   INITIAL IMPRESSION / ASSESSMENT AND PLAN / ED COURSE  Pertinent labs & imaging results that were available during my care of the patient were reviewed by me and considered in my medical decision making (see chart for details).  Review of the  [place]RS was performed in accordance of the [place] prior to dispensing any controlled drugs.     Patient presented to the emergency department for evaluation of right mid back pain for 4 days.  Vital signs and exam are reassuring.  Urinalysis noncontributory to infection.  No acute abnormalities on CT scanning.  Pain is likely musculoskeletal, as it is reproducible with palpation and with movement.  Patient was given oral Percocet, IM Toradol and IM Norflex for symptoms.  Patient will be discharged home with prescriptions for Toradol, Flexeril, Norco, Lidoderm. Patient is to follow up with primary care and Ortho as directed.  Referral to Ortho was given.  Patient is given ED precautions to return to the ED for any worsening or new symptoms.  [name] was evaluated in Emergency Department on 10/11/2019 for the symptoms described in the history of present illness. He was evaluated in the context of the global COVID-19 pandemic, which necessitated consideration that the patient might be at risk for infection with the SARS-CoV-2 virus that causes COVID-19. Institutional protocols and algorithms that pertain to the evaluation of patients at risk for COVID-19 are in a state of rapid change based on information released by regulatory bodies  including the CDC and federal and state organizations. These policies and algorithms were followed during the patient's care in the ED.   ____________________________________________  FINAL CLINICAL IMPRESSION(S) / ED DIAGNOSES  Final diagnoses:  Mid back pain      NEW MEDICATIONS STARTED DURING THIS VISIT:  ED Discharge Orders         Ordered    ketorolac (TORADOL) 10 MG tablet  Every 6 hours PRN     Discontinue  Reprint     10/11/19 1801    cyclobenzaprine (FLEXERIL) 5 MG tablet     Discontinue  Reprint     10/11/19 1801    HYDROcodone-acetaminophen (NORCO/VICODIN) 5-325 MG tablet  Every 6 hours PRN     Discontinue  Reprint     10/11/19 1801    lidocaine (LIDODERM) 5 %  Every 24 hours     Discontinue  Reprint     10/11/19 1801              This chart was dictated using voice recognition software/Dragon. Despite best efforts to proofread, errors can occur which can change the meaning. Any change was purely unintentional.    Laban Emperor, PA-C 10/11/19 2226    Nance Pear, MD 10/11/19 2258

## 2019-10-11 NOTE — ED Triage Notes (Signed)
Pt presents to ED via POV with c/o back pain. Pt states pain started Wednesday night. Pt states missed work on Thursday. Pt states pain is generalized back at this time. Pt states was told by his massage therapist significant other that it was kidney related. Pt states pain worse with movement at this time, reports pain intermittent at this time and has eased off some.

## 2019-11-02 ENCOUNTER — Other Ambulatory Visit: Payer: Self-pay

## 2019-11-03 ENCOUNTER — Other Ambulatory Visit: Payer: Self-pay | Admitting: *Deleted

## 2019-11-03 MED ORDER — SILDENAFIL CITRATE 100 MG PO TABS: 100.0000 mg | ORAL_TABLET | ORAL | 6 refills | Status: DC

## 2019-11-04 ENCOUNTER — Other Ambulatory Visit: Payer: Self-pay

## 2019-11-04 ENCOUNTER — Encounter: Payer: Self-pay | Admitting: Gastroenterology

## 2019-11-04 ENCOUNTER — Ambulatory Visit (INDEPENDENT_AMBULATORY_CARE_PROVIDER_SITE_OTHER): Payer: BLUE CROSS/BLUE SHIELD | Admitting: Gastroenterology

## 2019-11-04 ENCOUNTER — Telehealth: Payer: Self-pay

## 2019-11-04 VITALS — BP 151/84 | HR 59 | Temp 98.4°F | Ht 72.0 in | Wt 250.0 lb

## 2019-11-04 DIAGNOSIS — R1310 Dysphagia, unspecified: Secondary | ICD-10-CM

## 2019-11-04 DIAGNOSIS — R1319 Other dysphagia: Secondary | ICD-10-CM

## 2019-11-04 NOTE — Progress Notes (Signed)
Timothy Brown [address]  [place]  [place], [place] [address]  Main: [phone]  Fax: [phone]   Gastroenterology Consultation  Referring Provider:     Cletis Athens, MD Primary Care Physician:  Cletis Athens, MD Reason for Consultation:    dysphagia        HPI:   Chief complaint: Dysphagia  Timothy Brown is a 59 y.o. y/o male referred for consultation & management  by Dr. Cletis Athens, MD.  Patient reports history of intermittent dysphagia for the last 20 years to solids.  No episodes of food impaction.  Symptoms have become more frequent and last longer.  No prior upper endoscopy.  Has been trying to cut up his food into small pieces and chew it well but continues to have symptoms.  No abdominal pain, nausea or vomiting.  No altered bowel habits or blood in stool.  Last colonoscopy, 2018 by Dr. Vicente Males with one tubular adenoma polyp removed and repeat recommended in 5 years.  Past Medical History:  Diagnosis Date  . GERD (gastroesophageal reflux disease)    TUMS  . Gout   . Shortness of breath    WITH EXERTION    Past Surgical History:  Procedure Laterality Date  . ANTERIOR CERVICAL DECOMP/DISCECTOMY FUSION N/A 05/19/2012   Procedure: ANTERIOR CERVICAL DECOMPRESSION/DISCECTOMY FUSION 2 LEVELS;  Surgeon: Ophelia Charter, MD;  Location: [facility];  Service: Neurosurgery;  Laterality: N/A;  C56 C67 anterior cervical decompression with fusion interbody prothesis plating and bonegraft  . BREAST LUMPECTOMY Left    LUMP ON NIPPLE @ 59 YRS OLD  . COLONOSCOPY WITH PROPOFOL N/A 06/21/2016   Procedure: COLONOSCOPY WITH PROPOFOL;  Surgeon: Jonathon Bellows, MD;  Location: [facility] ENDOSCOPY;  Service: Endoscopy;  Laterality: N/A;    Prior to Admission medications   Medication Sig Start Date End Date Taking? Authorizing Provider  aspirin EC 81 MG tablet Take 81 mg by mouth daily.    [provider]  cholecalciferol (VITAMIN D) 1000 UNITS tablet Take 1,000 Units  by mouth daily.    [provider]  cyclobenzaprine (FLEXERIL) 5 MG tablet Take 1-2 tablets 3 times daily as needed 10/11/19   Laban Emperor, PA-C  diazepam (VALIUM) 5 MG tablet Take 1 tablet (5 mg total) by mouth every 6 (six) hours as needed. 09/08/19   Cletis Athens, MD  ketorolac (TORADOL) 10 MG tablet Take 1 tablet (10 mg total) by mouth every 6 (six) hours as needed. 10/11/19   Laban Emperor, PA-C  lidocaine (LIDODERM) 5 % Place 1 patch onto the skin daily. Remove & Discard patch within 12 hours or as directed by MD 10/11/19   Laban Emperor, PA-C  losartan (COZAAR) 50 MG tablet Take 1 tablet (50 mg total) by mouth daily. 09/08/19 12/07/19  Cletis Athens, MD  sildenafil (VIAGRA) 100 MG tablet Take 1 tablet (100 mg total) by mouth as directed. 11/03/19   Cletis Athens, MD  vitamin C (ASCORBIC ACID) 500 MG tablet Take 500 mg by mouth daily.    [provider]    Family History  Problem Relation Age of Onset  . Lung cancer Mother   . Lung cancer Father      Social History   Tobacco Use  . Smoking status: Never Smoker  . Smokeless tobacco: Never Used  Substance Use Topics  . Alcohol use: Yes    Alcohol/week: 4.0 - 6.0 standard drinks    Types: 4 - 6 Cans of beer per week  .  Drug use: Yes    Types: Marijuana    Comment: 40 YRS AGO    Allergies as of 11/04/2019  . (No Known Allergies)    Review of Systems:    All systems reviewed and negative except where noted in HPI.   Physical Exam:   Vitals:   11/04/19 1322  BP: (!) 151/84  Pulse: (!) 59  Temp: 98.4 F (36.9 C)  TempSrc: Oral  Weight: 250 lb (113.4 kg)  Height: 6' (1.829 m)    No LMP for male patient. Psych:  Alert and cooperative. Normal mood and affect. General:   Alert,  Well-developed, well-nourished, pleasant and cooperative in NAD Head:  Normocephalic and atraumatic. Eyes:  Sclera clear, no icterus.   Conjunctiva pink. Ears:  Normal auditory acuity. Nose:  No deformity, discharge, or  lesions. Mouth:  No deformity or lesions,oropharynx pink & moist. Neck:  Supple; no masses or thyromegaly. Abdomen:  Normal bowel sounds.  No bruits.  Soft, non-tender and non-distended without masses, hepatosplenomegaly or hernias noted.  No guarding or rebound tenderness.    Msk:  Symmetrical without gross deformities. Good, equal movement & strength bilaterally. Pulses:  Normal pulses noted. Extremities:  No clubbing or edema.  No cyanosis. Neurologic:  Alert and oriented x3;  grossly normal neurologically. Skin:  Intact without significant lesions or rashes. No jaundice. Lymph Nodes:  No significant cervical adenopathy. Psych:  Alert and cooperative. Normal mood and affect.   Labs: CBC No results found for: WBC, RBC, HGB, HCT, PLT, MCV, MCH, MCHC, RDW, LYMPHSABS, MONOABS, EOSABS, BASOSABS CMP  No results found for: NA, K, CL, CO2, GLUCOSE, BUN, CREATININE, CALCIUM, PROT, ALBUMIN, AST, ALT, ALKPHOS, BILITOT, GFRNONAA, GFRAA  Imaging Studies: CT Renal Stone Study  Result Date: 10/11/2019 CLINICAL DATA:  Flank pain EXAM: CT ABDOMEN AND PELVIS WITHOUT CONTRAST TECHNIQUE: Multidetector CT imaging of the abdomen and pelvis was performed following the standard protocol without IV contrast. COMPARISON:  None. FINDINGS: Lower chest: Lung bases are clear. No effusions. Heart is normal size. Hepatobiliary: No focal hepatic abnormality. Gallbladder unremarkable. Pancreas: No focal abnormality or ductal dilatation. Spleen: No focal abnormality.  Normal size. Adrenals/Urinary Tract: No adrenal abnormality. No focal renal abnormality. No stones or hydronephrosis. Urinary bladder is unremarkable. Stomach/Bowel: Colonic diverticulosis. No active diverticulitis. Normal appendix. Stomach and small bowel decompressed, unremarkable. Vascular/Lymphatic: No evidence of aneurysm or adenopathy. Reproductive: No visible focal abnormality. Other: No free fluid or free air. Musculoskeletal: No acute bony abnormality.  IMPRESSION: No renal or ureteral stones.  No hydronephrosis. Diffuse colonic diverticulosis.  No active diverticulitis. Electronically Signed   By: Rolm Baptise M.D.   On: 10/11/2019 17:21    Assessment and Plan:   SERIGNE KUBICEK is a 59 y.o. y/o male has been referred for dysphagia  EGD indicated for further evaluation of dysphagia and rule out any underlying lesions  Patient advised to follow dysphagia diet and handout given for the same to avoid any episodes of food impaction  I have discussed alternative options, risks & benefits,  which include, but are not limited to, bleeding, infection, perforation,respiratory complication & drug reaction.  The patient agrees with this plan & written consent will be obtained.       Dr Timothy Brown  Speech recognition software was used to dictate the above note.

## 2019-11-04 NOTE — Patient Instructions (Signed)
Dysphagia Eating Plan, Minced and Moist Foods This eating plan is for people with moderate swallowing problems who are transitioning from pureed to solid foods. Moist and minced foods are soft and cut into very small chunks so that they can be swallowed safely. On this eating plan, you may be instructed to drink liquids that are thickened. Work with your health care provider and your diet and nutrition specialist (dietitian) to make sure that you are following the diet safely and getting all the nutrients you need. What are tips for following this plan? General guidelines for foods   You may eat foods that are soft and moist.  Always test food texture before taking a bite. Poke food with a fork or spoon to make sure it is tender.  Take small bites. Each bite should be smaller than your little finger nail (about 4 mm by 4 mm).  If you were on a pureed food eating plan, you may still eat any of the foods included in that diet.  Avoid foods that are dry, hard, sticky, chewy, coarse, or crunchy.  Avoid foods that separate into thin liquids and solids, such as cereal with milk or chunky soups.  Avoid liquids that have seeds or chunks.  If instructed by your health care provider, thicken liquids. Follow your health care provider's instructions about what products to use, how to do this, and to what thickness. ? You may use a commercial thickener, rice cereal, or potato flakes. ? Thickened liquids are usually a "pudding-like" consistency, or they may be as thick as honey or thick enough to eat with a spoon. Cooking  You may need to use a blender, whisk, or masher to soften some of your foods.  To moisten foods, you may add liquids while you are blending, mashing, or grinding your foods to the right consistency. These liquids include gravies, sauces, vegetable or fruit juice, milk, half and half, or water.  Reheat foods slowly to prevent a tough crust from forming. Meal planning  Eat a  variety of foods in order to get all the nutrients you need.  Follow your meal plan as told by your health care provider or dietitian. What foods are allowed? Grains Soaked soft breads without nuts or seeds. Pancakes, sweet rolls, pastries, and [place] toast that have been moistened with syrup or sauce. Well-cooked pasta, noodles, rice, and bread dressing in very small pieces and thick sauce. Soft dumplings or spaetzle in very small pieces and butter or gravy. Soft-cooked cereals. Vegetables Very soft, well-cooked vegetables in very small pieces. Soft-cooked, mashed potatoes. Thickened vegetable juice. Fruits Canned or cooked fruits that are soft or moist and do not have skin or seeds. Fresh, soft bananas. Thickened fruit juices. Meat and other protein foods Tender, moist, and finely minced or ground meats or poultry. Moist meatballs or meatloaf. Fish without bones. Scrambled, poached, or soft-cooked eggs. Tofu. Tempeh and meat alternatives in very small pieces. Well-cooked, moistened and mashed beans, baked beans, peas, and other legumes. Dairy Thickened milk. Cream cheese. Yogurt. Cottage cheese. Sour cream. Fats and oils Butter. Margarine. Cream for cereal, depending on liquid consistency allowed. Gravy. Cream sauces. Mayonnaise. Sweets and desserts Pudding. Custard. Ice cream and sherbet. Whipped toppings. Soft, moist cakes. Icing. Jelly. Jams and preserves without seeds. Seasoning and other foods Sauces and salsas that have soft chunks that are smaller than 50mm. Salad dressings. Casseroles with small pieces of tender meat. All seasonings and sweeteners. Beverages Anything prepared at the thickness recommended by your  dietitian. What foods are not allowed? Grains Breads that are hard or have nuts or seeds. Dry biscuits, pancakes, waffles, and bread dressing. Coarse cereals. Cereals that have nuts, seeds, dried fruits, or coconut. Sticky rice. Large pieces of pasta. Vegetables All raw  vegetables. Tough, fibrous, chewy, or stringy cooked vegetables, such as celery, peas, broccoli, cabbage, Brussels sprouts, and asparagus. Potato skins. Potato and other vegetable chips. Fried or French-fried potatoes. Cooked corn and peas. Fruits Hard, crunchy, stringy, high-pulp, and juicy raw fruits such as apples, pineapple, papaya, and watermelon. Fruits with skins and seeds, such as grapes. Dried fruit and fruit leather. Meats and other protein foods Large pieces of meat. Dry, tough meats, such as bacon, sausage, and hot dogs. Chicken, [place], or fish with skin and bones. Crunchy peanut butter. Nuts. Seeds. Dairy Yogurt with nuts, seeds, or large chunks. Large chunks of cheese. Frozen desserts and milk consistency not allowed by your dietitian. Sweets and desserts Coarse, hard, chewy, or sticky desserts. Any dessert with nuts, seeds, coconut, pineapple, or dried fruit. Bread pudding. Seasoning and other foods Soups and casseroles with large chunks. Sandwiches. Pizza. Summary  Moist and minced foods can be helpful for people with moderate swallowing problems.  On the dysphagia eating plan, you may eat foods that are soft, moist, and cut into pieces smaller than 83mm by 80mm.  You may be instructed to thicken liquids. Follow your health care provider's instructions about how to do this and to what consistency. This information is not intended to replace advice given to you by your health care provider. Make sure you discuss any questions you have with your health care provider. Document Revised: 06/12/2018 Document Reviewed: 06/01/2016 Elsevier Patient Education  [employer].

## 2019-11-04 NOTE — Telephone Encounter (Signed)
Patient notified front office that he needed to cancel his EGD because he is not in network with [facility].  Dr. Michele Mcalpine CMA has been made aware.  I will route to Dr. Bonna Gains to notify her also.  Thanks,  [place], [place]

## 2019-11-30 ENCOUNTER — Other Ambulatory Visit: Payer: Self-pay | Admitting: *Deleted

## 2019-11-30 MED ORDER — DOXYCYCLINE HYCLATE 100 MG PO TABS: 100.0000 mg | ORAL_TABLET | Freq: Every day | ORAL | 4 refills | Status: DC

## 2019-12-01 ENCOUNTER — Other Ambulatory Visit: Payer: Self-pay | Admitting: *Deleted

## 2019-12-01 MED ORDER — DOXYCYCLINE HYCLATE 100 MG PO TABS
100.0000 mg | ORAL_TABLET | Freq: Every day | ORAL | 4 refills | Status: DC
Start: 2019-12-01 — End: 2020-05-31

## 2019-12-30 ENCOUNTER — Other Ambulatory Visit: Payer: Self-pay

## 2019-12-30 DIAGNOSIS — F419 Anxiety disorder, unspecified: Secondary | ICD-10-CM

## 2019-12-30 MED ORDER — SILDENAFIL CITRATE 100 MG PO TABS
100.0000 mg | ORAL_TABLET | ORAL | 6 refills | Status: DC
Start: 2019-12-30 — End: 2020-02-22

## 2019-12-30 MED ORDER — DIAZEPAM 5 MG PO TABS: 5.0000 mg | ORAL_TABLET | Freq: Four times a day (QID) | ORAL | 1 refills | Status: DC | PRN

## 2020-02-05 ENCOUNTER — Other Ambulatory Visit: Payer: Self-pay

## 2020-02-19 ENCOUNTER — Other Ambulatory Visit: Payer: Self-pay | Admitting: Internal Medicine

## 2020-03-14 ENCOUNTER — Ambulatory Visit: Payer: BLUE CROSS/BLUE SHIELD | Admitting: Internal Medicine

## 2020-04-12 DIAGNOSIS — M542 Cervicalgia: Secondary | ICD-10-CM | POA: Insufficient documentation

## 2020-04-12 DIAGNOSIS — M4712 Other spondylosis with myelopathy, cervical region: Secondary | ICD-10-CM | POA: Insufficient documentation

## 2020-05-31 ENCOUNTER — Other Ambulatory Visit: Payer: Self-pay | Admitting: Internal Medicine

## 2020-09-04 ENCOUNTER — Other Ambulatory Visit: Payer: Self-pay | Admitting: Internal Medicine

## 2020-09-04 DIAGNOSIS — F419 Anxiety disorder, unspecified: Secondary | ICD-10-CM

## 2020-09-19 ENCOUNTER — Ambulatory Visit: Payer: 59 | Admitting: Internal Medicine

## 2020-09-22 ENCOUNTER — Other Ambulatory Visit: Payer: Self-pay

## 2020-09-22 ENCOUNTER — Encounter: Payer: Self-pay | Admitting: Family Medicine

## 2020-09-22 ENCOUNTER — Ambulatory Visit (INDEPENDENT_AMBULATORY_CARE_PROVIDER_SITE_OTHER): Payer: 59 | Admitting: Family Medicine

## 2020-09-22 VITALS — BP 131/90 | HR 84 | Ht 72.0 in | Wt 256.1 lb

## 2020-09-22 DIAGNOSIS — M109 Gout, unspecified: Secondary | ICD-10-CM

## 2020-09-22 DIAGNOSIS — F419 Anxiety disorder, unspecified: Secondary | ICD-10-CM

## 2020-09-22 MED ORDER — ALLOPURINOL 100 MG PO TABS: 100.0000 mg | ORAL_TABLET | Freq: Every day | ORAL | 6 refills | Status: DC

## 2020-09-22 MED ORDER — DIAZEPAM 5 MG PO TABS: 5.0000 mg | ORAL_TABLET | Freq: Four times a day (QID) | ORAL | 1 refills | Status: DC | PRN

## 2020-09-22 NOTE — Assessment & Plan Note (Signed)
Patient had gout flare two weeks ago, no pain now, rx Allopurinol

## 2020-09-22 NOTE — Progress Notes (Signed)
Established Patient Office Visit  SUBJECTIVE:  Subjective  Patient ID: Timothy Brown, male    DOB: 1960/12/29  Age: 60 y.o. MRN: [id number]  CC:  Chief Complaint  Patient presents with   Follow-up    HPI Timothy Brown is a 60 y.o. male presenting today for     Past Medical History:  Diagnosis Date   GERD (gastroesophageal reflux disease)    TUMS   Gout    Shortness of breath    WITH EXERTION    Past Surgical History:  Procedure Laterality Date   ANTERIOR CERVICAL DECOMP/DISCECTOMY FUSION N/A 05/19/2012   Procedure: ANTERIOR CERVICAL DECOMPRESSION/DISCECTOMY FUSION 2 LEVELS;  Surgeon: Ophelia Charter, MD;  Location: [place] [facility];  Service: Neurosurgery;  Laterality: N/A;  C56 C67 anterior cervical decompression with fusion interbody prothesis plating and bonegraft   BREAST LUMPECTOMY Left    LUMP ON NIPPLE @ 60 YRS OLD   COLONOSCOPY WITH PROPOFOL N/A 06/21/2016   Procedure: COLONOSCOPY WITH PROPOFOL;  Surgeon: Jonathon Bellows, MD;  Location: [facility];  Service: Endoscopy;  Laterality: N/A;    Family History  Problem Relation Age of Onset   Lung cancer Mother    Lung cancer Father     Social History   Socioeconomic History   Marital status: Single    Spouse name: Not on file   Number of children: Not on file   Years of education: Not on file   Highest education level: Not on file  Occupational History   Not on file  Tobacco Use   Smoking status: Never   Smokeless tobacco: Never  Substance and Sexual Activity   Alcohol use: Yes    Alcohol/week: 4.0 - 6.0 standard drinks    Types: 4 - 6 Cans of beer per week   Drug use: Yes    Types: Marijuana    Comment: 59 YRS AGO   Sexual activity: Not on file  Other Topics Concern   Not on file  Social History Narrative   Not on file   Social Determinants of Health   Financial Resource Strain: Not on file  Food Insecurity: Not on file  Transportation Needs: Not on file  Physical Activity: Not on file   Stress: Not on file  Social Connections: Not on file  Intimate Partner Violence: Not on file     Current Outpatient Medications:    allopurinol (ZYLOPRIM) 100 MG tablet, Take 1 tablet (100 mg total) by mouth daily., Disp: 30 tablet, Rfl: 6   aspirin EC 81 MG tablet, Take 81 mg by mouth daily., Disp: , Rfl:    cholecalciferol (VITAMIN D) 1000 UNITS tablet, Take 1,000 Units by mouth daily., Disp: , Rfl:    diazepam (VALIUM) 5 MG tablet, Take 1 tablet (5 mg total) by mouth every 6 (six) hours as needed., Disp: 30 tablet, Rfl: 1   doxycycline (VIBRA-TABS) 100 MG tablet, TAKE 1 TABLET BY MOUTH EVERY DAY, Disp: 30 tablet, Rfl: 3   losartan (COZAAR) 50 MG tablet, Take 1 tablet (50 mg total) by mouth daily., Disp: 90 tablet, Rfl: 2   sildenafil (VIAGRA) 100 MG tablet, TAKE 1 TABLET BY MOUTH AS DIRECTED AS NEEDED, Disp: 6 tablet, Rfl: 5   vitamin C (ASCORBIC ACID) 500 MG tablet, Take 500 mg by mouth daily., Disp: , Rfl:    No Known Allergies  ROS Review of Systems  Constitutional: Negative.   HENT: Negative.    Respiratory: Negative.    Cardiovascular: Negative.  Genitourinary: Negative.   Psychiatric/Behavioral: Negative.      OBJECTIVE:    Physical Exam HENT:     Right Ear: Tympanic membrane normal.     Left Ear: Tympanic membrane normal.  Cardiovascular:     Rate and Rhythm: Normal rate and regular rhythm.  Skin:    General: Skin is warm.  Neurological:     Mental Status: He is alert.  Psychiatric:        Mood and Affect: Mood normal.    BP 131/90   Pulse 84   Ht 6' (1.829 m)   Wt 256 lb 1.6 oz (116.2 kg)   BMI 34.73 kg/m  Wt Readings from Last 3 Encounters:  09/22/20 256 lb 1.6 oz (116.2 kg)  11/04/19 250 lb (113.4 kg)  10/11/19 245 lb (111.1 kg)    Health Maintenance Due  Topic Date Due   COVID-19 Vaccine (1) Never done   HIV Screening  Never done   Hepatitis C Screening  Never done   TETANUS/TDAP  Never done   Zoster Vaccines- Shingrix (1 of 2) Never done     There are no preventive care reminders to display for this patient.  No flowsheet data found. No flowsheet data found.  No results found for: TSH No results found for: ALBUMIN, ANIONGAP, EGFR, GFR No results found for: CHOL, HDL, LDLCALC, CHOLHDL No results found for: TRIG No results found for: HGBA1C    ASSESSMENT & PLAN:   Problem List Items Addressed This Visit       Musculoskeletal and Integument   Acute gout of left foot - Primary    Patient had gout flare two weeks ago, no pain now, rx Allopurinol       Relevant Medications   allopurinol (ZYLOPRIM) 100 MG tablet     Other   Anxiety    Patient does well with Valium at night to sleep. No se or signs of abuse.        Relevant Medications   diazepam (VALIUM) 5 MG tablet    Meds ordered this encounter  Medications   allopurinol (ZYLOPRIM) 100 MG tablet    Sig: Take 1 tablet (100 mg total) by mouth daily.    Dispense:  30 tablet    Refill:  6   diazepam (VALIUM) 5 MG tablet    Sig: Take 1 tablet (5 mg total) by mouth every 6 (six) hours as needed.    Dispense:  30 tablet    Refill:  1    This request is for a new prescription for a controlled substance as required by Federal/State law.      Follow-up: No follow-ups on file.    Beckie Salts, [place] [address] [place], [place] [address]

## 2020-09-22 NOTE — Assessment & Plan Note (Signed)
Patient does well with Valium at night to sleep. No se or signs of abuse.

## 2020-11-19 ENCOUNTER — Other Ambulatory Visit: Payer: Self-pay

## 2020-11-19 ENCOUNTER — Emergency Department
Admission: EM | Admit: 2020-11-19 | Discharge: 2020-11-19 | Disposition: A | Discharge status: Home / Self Care | Payer: 59 | Attending: Emergency Medicine | Admitting: Emergency Medicine

## 2020-11-19 ENCOUNTER — Emergency Department: Payer: 59

## 2020-11-19 ENCOUNTER — Encounter: Payer: Self-pay | Admitting: Radiology

## 2020-11-19 DIAGNOSIS — W19XXXA Unspecified fall, initial encounter: Secondary | ICD-10-CM | POA: Diagnosis not present

## 2020-11-19 DIAGNOSIS — Z79899 Other long term (current) drug therapy: Secondary | ICD-10-CM | POA: Insufficient documentation

## 2020-11-19 DIAGNOSIS — I10 Essential (primary) hypertension: Secondary | ICD-10-CM | POA: Insufficient documentation

## 2020-11-19 DIAGNOSIS — S43004A Unspecified dislocation of right shoulder joint, initial encounter: Secondary | ICD-10-CM

## 2020-11-19 DIAGNOSIS — Y9301 Activity, walking, marching and hiking: Secondary | ICD-10-CM | POA: Insufficient documentation

## 2020-11-19 DIAGNOSIS — Z7982 Long term (current) use of aspirin: Secondary | ICD-10-CM | POA: Insufficient documentation

## 2020-11-19 DIAGNOSIS — S43014A Anterior dislocation of right humerus, initial encounter: Secondary | ICD-10-CM | POA: Insufficient documentation

## 2020-11-19 DIAGNOSIS — T1490XA Injury, unspecified, initial encounter: Secondary | ICD-10-CM

## 2020-11-19 DIAGNOSIS — S4991XA Unspecified injury of right shoulder and upper arm, initial encounter: Secondary | ICD-10-CM | POA: Diagnosis present

## 2020-11-19 DIAGNOSIS — Y92009 Unspecified place in unspecified non-institutional (private) residence as the place of occurrence of the external cause: Secondary | ICD-10-CM | POA: Diagnosis not present

## 2020-11-19 LAB — CBC
HCT: 42.9 % (ref 39.0–52.0)
Hemoglobin: 14.9 g/dL (ref 13.0–17.0)
MCH: 33.9 pg (ref 26.0–34.0)
MCHC: 34.7 g/dL (ref 30.0–36.0)
MCV: 97.7 fL (ref 80.0–100.0)
Platelets: 154 10*3/uL (ref 150–400)
RBC: 4.39 MIL/uL (ref 4.22–5.81)
RDW: 13.2 % (ref 11.5–15.5)
WBC: 8.4 10*3/uL (ref 4.0–10.5)
nRBC: 0 % (ref 0.0–0.2)

## 2020-11-19 LAB — BASIC METABOLIC PANEL
Anion gap: 11 (ref 5–15)
BUN: 18 mg/dL (ref 6–20)
CO2: 23 mmol/L (ref 22–32)
Calcium: 8.9 mg/dL (ref 8.9–10.3)
Chloride: 102 mmol/L (ref 98–111)
Creatinine, Ser: 1 mg/dL (ref 0.61–1.24)
GFR, Estimated: >60 mL/min (ref >60)
Glucose, Bld: 162 mg/dL — ABNORMAL HIGH (ref 70–99)
Potassium: 4 mmol/L (ref 3.5–5.1)
Sodium: 136 mmol/L (ref 135–145)

## 2020-11-19 MED ORDER — MORPHINE SULFATE (PF) 4 MG/ML IV SOLN
4.0000 mg | Freq: Once | INTRAVENOUS | Status: AC
Start: 2020-11-19 — End: 2020-11-19
  Administered 2020-11-19: 4 mg via INTRAVENOUS
  Filled 2020-11-19: qty 1

## 2020-11-19 MED ORDER — ONDANSETRON HCL 4 MG/2ML IJ SOLN: 4.0000 mg | Freq: Once | INTRAMUSCULAR | Status: AC

## 2020-11-19 MED ORDER — KETAMINE HCL 50 MG/5ML IJ SOSY
1.0000 mg/kg | PREFILLED_SYRINGE | Freq: Once | INTRAMUSCULAR | Status: AC
  Administered 2020-11-19: 113 mg via INTRAVENOUS
  Filled 2020-11-19: qty 15

## 2020-11-19 MED ORDER — ONDANSETRON HCL 4 MG/2ML IJ SOLN
INTRAMUSCULAR | Status: AC
  Administered 2020-11-19: 4 mg
  Filled 2020-11-19: qty 2

## 2020-11-19 MED ORDER — MORPHINE SULFATE (PF) 4 MG/ML IV SOLN
4.0000 mg | Freq: Once | INTRAVENOUS | Status: AC
Start: 2020-11-19 — End: 2020-11-19

## 2020-11-19 MED ORDER — MORPHINE SULFATE (PF) 4 MG/ML IV SOLN
INTRAVENOUS | Status: AC
  Administered 2020-11-19: 4 mg
  Filled 2020-11-19: qty 1

## 2020-11-19 MED ORDER — SODIUM CHLORIDE 0.9 % IV SOLN: Freq: Once | INTRAVENOUS | Status: AC

## 2020-11-19 NOTE — ED Triage Notes (Signed)
Pt got dizzy tonight and fell against wall. Pt was given 16mg of fentanyl and '4mg'$  of zofran iv enroute. Pt with deformity noted to right shoulder with cms intact. Pt in obvious distress. No loc.

## 2020-11-19 NOTE — Sedation Documentation (Signed)
Vital signs stable. 

## 2020-11-19 NOTE — Discharge Instructions (Addendum)
Please seek medical attention for any high fevers, chest pain, shortness of breath, change in behavior, persistent vomiting, bloody stool or any other new or concerning symptoms.  

## 2020-11-19 NOTE — ED Notes (Signed)
Pt. States when he got up he was light- headed, states he takes valium and Zz-quil to sleep. Pt. Denies any CP, SOB, or diaphoresis with light-headedness.

## 2020-11-19 NOTE — Sedation Documentation (Signed)
Pt. Sleepy, Sitting up in bed, conversational and oriented, taking sips of water unassisted. VS stable, NAD. Right arm in sling as applied by this RN and Dr. Archie Balboa.

## 2020-11-19 NOTE — ED Provider Notes (Signed)
[facility] Emergency Department Provider Note   ____________________________________________   I have reviewed the triage vital signs and the nursing notes.   HISTORY  Chief Complaint Right shoulder pain   History limited by: Not Limited   HPI Timothy Brown is a 60 y.o. male who presents to the emergency department today with right shoulder pain after a fall. The patient was walking in his house when he fell against the wall, had his right arm out in front of him. The patient states that he fell and his right arm stayed up. Had immediate onset of pain. Denies any radiation of the pain. The patient states that he injured his shoulder once at the age of 61.    Records reviewed. Per medical record review patient has a history of GERD, HTN.  Past Medical History:  Diagnosis Date   GERD (gastroesophageal reflux disease)    TUMS   Gout    Shortness of breath    WITH EXERTION    Patient Active Problem List   Diagnosis Date Noted   Acute gout of left foot 09/22/2020   Anxiety 09/08/2019   Esophageal dysphagia 09/08/2019   Class 2 obesity due to excess calories without serious comorbidity with body mass index (BMI) of 35.0 to 35.9 in adult 09/08/2019   HTN (hypertension) 06/15/2019   Nerve damage 11/30/2015   Numbness and tingling 11/30/2015    Past Surgical History:  Procedure Laterality Date   ANTERIOR CERVICAL DECOMP/DISCECTOMY FUSION N/A 05/19/2012   Procedure: ANTERIOR CERVICAL DECOMPRESSION/DISCECTOMY FUSION 2 LEVELS;  Surgeon: Ophelia Charter, MD;  Location: [place] [facility];  Service: Neurosurgery;  Laterality: N/A;  C56 C67 anterior cervical decompression with fusion interbody prothesis plating and bonegraft   BREAST LUMPECTOMY Left    LUMP ON NIPPLE @ 60 YRS OLD   COLONOSCOPY WITH PROPOFOL N/A 06/21/2016   Procedure: COLONOSCOPY WITH PROPOFOL;  Surgeon: Jonathon Bellows, MD;  Location: [facility];  Service: Endoscopy;  Laterality: N/A;    Prior  to Admission medications   Medication Sig Start Date End Date Taking? Authorizing Provider  allopurinol (ZYLOPRIM) 100 MG tablet Take 1 tablet (100 mg total) by mouth daily. 09/22/20   Beckie Salts, FNP  aspirin EC 81 MG tablet Take 81 mg by mouth daily.    [provider]  cholecalciferol (VITAMIN D) 1000 UNITS tablet Take 1,000 Units by mouth daily.    [provider]  diazepam (VALIUM) 5 MG tablet Take 1 tablet (5 mg total) by mouth every 6 (six) hours as needed. 09/22/20   Beckie Salts, FNP  doxycycline (VIBRA-TABS) 100 MG tablet TAKE 1 TABLET BY MOUTH EVERY DAY 05/31/20   Cletis Athens, MD  losartan (COZAAR) 50 MG tablet Take 1 tablet (50 mg total) by mouth daily. 09/08/19 12/07/19  Cletis Athens, MD  sildenafil (VIAGRA) 100 MG tablet TAKE 1 TABLET BY MOUTH AS DIRECTED AS NEEDED 02/22/20   Cletis Athens, MD  vitamin C (ASCORBIC ACID) 500 MG tablet Take 500 mg by mouth daily.    [provider]    Allergies Patient has no known allergies.  Family History  Problem Relation Age of Onset   Lung cancer Mother    Lung cancer Father     Social History Social History   Tobacco Use   Smoking status: Never   Smokeless tobacco: Never  Substance Use Topics   Alcohol use: Yes    Alcohol/week: 4.0 - 6.0 standard drinks    Types: 4 - 6  Cans of beer per week   Drug use: Yes    Types: Marijuana    Comment: 40 YRS AGO    Review of Systems Constitutional: No fever/chills Eyes: No visual changes. ENT: No sore throat. Cardiovascular: Denies chest pain. Respiratory: Denies shortness of breath. Gastrointestinal: No abdominal pain.  No nausea, no vomiting.  No diarrhea.   Genitourinary: Negative for dysuria. Musculoskeletal: Positive for right shoulder pain. Skin: Negative for rash. Neurological: Negative for headaches, focal weakness or numbness.  ____________________________________________   PHYSICAL EXAM:  VITAL SIGNS: ED Triage Vitals  Enc Vitals  Group     BP 11/19/20 0200 (!) 125/96     Pulse Rate 11/19/20 0200 62     Resp 11/19/20 0200 (!) 22     Temp --      Temp Source 11/19/20 0200 Oral     SpO2 11/19/20 0200 96 %     Weight 11/19/20 0201 250 lb (113.4 kg)     Height 11/19/20 0201 6' (1.829 m)     Head Circumference --      Peak Flow --      Pain Score 11/19/20 0201 10   Constitutional: Alert and oriented.  Eyes: Conjunctivae are normal.  ENT      Head: Normocephalic and atraumatic.      Nose: No congestion/rhinnorhea.      Mouth/Throat: Mucous membranes are moist.      Neck: No stridor. Hematological/Lymphatic/Immunilogical: No cervical lymphadenopathy. Cardiovascular: Normal rate, regular rhythm.  No murmurs, rubs, or gallops.  Respiratory: Normal respiratory effort without tachypnea nor retractions. Breath sounds are clear and equal bilaterally. No wheezes/rales/rhonchi. Gastrointestinal: Soft and non tender. No rebound. No guarding.  Genitourinary: Deferred Musculoskeletal: Deformity to right shoulder. NV intact distally. Sensation intact over deltoid.  Neurologic:  Normal speech and language. No gross focal neurologic deficits are appreciated.  Skin:  Skin is warm, dry and intact. No rash noted. Psychiatric: Mood and affect are normal. Speech and behavior are normal. Patient exhibits appropriate insight and judgment.  ____________________________________________    LABS (pertinent positives/negatives)  CBC wbc 8.4, hgb 14.9, plt 154 BMP wnl except glu 162  ____________________________________________   EKG  I, Nance Pear, attending physician, personally viewed and interpreted this EKG  EKG Time: 0216 Rate: 60 Rhythm: normal sinus rhythm Axis: normal Intervals: qtc 432 QRS: narrow ST changes: no st elevation Impression: normal ekg   ____________________________________________    RADIOLOGY  Right shoulder Anterior dislocation  Right shoulder Successful relocation  I, Nance Pear, personally viewed and evaluated these images (plain radiographs) as part of my medical decision making, as well as reviewing the written report by the radiologist.  ____________________________________________   PROCEDURES  .Sedation  Date/Time: 11/19/2020 5:24 AM Performed by: Nance Pear, MD Authorized by: Nance Pear, MD   Consent:    Consent obtained:  Written   Consent given by:  Patient   Risks discussed:  Prolonged hypoxia resulting in organ damage, respiratory compromise necessitating ventilatory assistance and intubation, vomiting, nausea, inadequate sedation and dysrhythmia   Alternatives discussed:  Analgesia without sedation Universal protocol:    Immediately prior to procedure, a time out was called: yes   Indications:    Procedure performed:  Dislocation reduction Pre-sedation assessment:    Time since last food or drink:  Unknown   ASA classification: class 1 - normal, healthy patient     Mallampati score:  I - soft palate, uvula, fauces, pillars visible   Neck mobility: normal  Pre-sedation assessments completed and reviewed: airway patency, cardiovascular function, hydration status, mental status, nausea/vomiting, pain level, respiratory function and temperature   Procedure details (see MAR for exact dosages):    Total Provider sedation time (minutes):  20 Post-procedure details:    Post-sedation assessments completed and reviewed: airway patency, cardiovascular function, hydration status, mental status, nausea/vomiting, pain level, respiratory function and temperature     Patient is stable for discharge or admission: yes     Procedure completion:  Tolerated well, no immediate complications  Reduction of dislocation  Performed by: Nance Pear Authorized by: Nance Pear Consent: Written consent obtained. Risks and benefits: risks, benefits and alternatives were discussed Consent given by: patient Required items: required blood  products, implants, devices, and special equipment available Time out: Immediately prior to procedure a "time out" was called to verify the correct patient, procedure, equipment, support staff and site/side marked as required.  Patient sedated: Yes  Vitals: Vital signs were monitored during sedation. Patient tolerance: Patient tolerated the procedure well with no immediate complications. Joint: right shoulder Reduction technique: traction/countertraction   ____________________________________________   INITIAL IMPRESSION / ASSESSMENT AND PLAN / ED COURSE  Pertinent labs & imaging results that were available during my care of the patient were reviewed by me and considered in my medical decision making (see chart for details).   Patient presents to the emergency department because of concern for right shoulder pain after a fall. Clinical exam and x-rays consistent with anterior dislocation. Shoulder was successfully reduced with sedation. Discussed care and follow up with patient.   ____________________________________________   FINAL CLINICAL IMPRESSION(S) / ED DIAGNOSES  Final diagnoses:  Injury  Dislocation of right shoulder joint, initial encounter     Note: This dictation was prepared with Dragon dictation. Any transcriptional errors that result from this process are unintentional     Nance Pear, MD 11/19/20 [phone]

## 2020-11-19 NOTE — ED Notes (Signed)
ED Provider at bedside. 

## 2020-11-19 NOTE — ED Notes (Signed)
Pt. Unable to sign consent for procedure, gave verbal consent and requested sig. Other sign for him.

## 2021-01-03 ENCOUNTER — Other Ambulatory Visit: Payer: Self-pay

## 2021-01-03 ENCOUNTER — Ambulatory Visit: Payer: Self-pay

## 2021-01-03 ENCOUNTER — Encounter: Payer: Self-pay | Admitting: Family Medicine

## 2021-01-03 ENCOUNTER — Ambulatory Visit (INDEPENDENT_AMBULATORY_CARE_PROVIDER_SITE_OTHER): Payer: 59 | Admitting: Family Medicine

## 2021-01-03 VITALS — BP 104/70 | HR 56 | Ht 72.0 in | Wt 248.6 lb

## 2021-01-03 DIAGNOSIS — S43431A Superior glenoid labrum lesion of right shoulder, initial encounter: Secondary | ICD-10-CM | POA: Diagnosis not present

## 2021-01-03 DIAGNOSIS — S43014S Anterior dislocation of right humerus, sequela: Secondary | ICD-10-CM | POA: Diagnosis not present

## 2021-01-03 NOTE — Patient Instructions (Addendum)
Nice to meet you today.  I've referred you to Physical Therapy.  Let us know if you don't hear from them in one week.   Follow-up: in about 1 month or let me know for MRI arthrogram.

## 2021-01-03 NOTE — Progress Notes (Signed)
I, Wendy Poet, LAT, ATC, am serving as scribe for Dr. Lynne Leader.  Subjective:    CC: R shoulder pain  HPI: Pt is a 60 y/o male c/o R shoulder pain ongoing since 11/19/20. MOI: Pt suffered a [place] mechanism when walking into his house. Pt was seen at the [facility] ED following the fall w/ a R anterior shoulder dislocation which was successfully reduced. Pt has also been seen for this injury by [facility], [facility], most recently on 12/15/20. Today, pt reports that he con't to have pain, particularly at night, and has pain w/ attempts at R shoulder flexion above 90 deg.  He has not been doing any supervised rehab.  He does note some mechanical symptoms.  He works as a Chief Strategy Officer, [employer]imedia programmer houses.   He would like to avoid surgery if possible.  Dx imaging: 11/19/20 R shoulder XR (pre & post reduction)  Pertinent review of Systems: No fevers or chills  Relevant historical information: Hypertension   Objective:    Vitals:   01/03/21 1609  BP: 104/70  Pulse: (!) 56  SpO2: 97%   General: Well Developed, well nourished, and in no acute distress.   MSK: Right shoulder normal-appearing Nontender. Range of motion abduction 110 degrees.  External rotation full internal rotation lumbar spine. Strength within limits of motion 4/5 abduction 5/5 external rotation 5/5 internal rotation. Palpable and audible click with abduction. Positive Hawkins and Neer's test.  Positive empty can test. Positive O'Brien's test. Positive anterior apprehension test.  Lab and Radiology Results  Diagnostic Limited MSK Ultrasound of: Right shoulder Biceps tendon intact normal-appearing Subscapularis tendon is intact. Supraspinatus tendon appears to be intact. Mild subacromial bursitis present. Infraspinatus tendon is intact. AC joint minimal effusion. Impression: No large retracted rotator cuff tear  DG Shoulder Right Portable  Result Date: 11/19/2020 CLINICAL DATA:  Reduced  shoulder EXAM: PORTABLE RIGHT SHOULDER COMPARISON:  Earlier today FINDINGS: Resolved dislocation.  No acute fracture or new finding. IMPRESSION: Relocated shoulder. Electronically Signed   By: Jorje Guild M.D.   On: 11/19/2020 04:17   DG Shoulder Right Port  Result Date: 11/19/2020 CLINICAL DATA:  Shoulder injury, fall EXAM: PORTABLE RIGHT SHOULDER COMPARISON:  None. FINDINGS: Anterior glenohumeral dislocation, with humeral head inferior to the coracoid process of the scapula. No definite acute fracture. IMPRESSION: Anterior glenohumeral dislocation. Electronically Signed   By: Merilyn Baba M.D.   On: 11/19/2020 02:53   I, Lynne Leader, personally (independently) visualized and performed the interpretation of the images attached in this note.   Impression and Recommendations:    Assessment and Plan: 60 y.o. male with right shoulder pain and instability following a first shoulder dislocation about 6 weeks ago.  Since then patient has been treated conservatively with some limited home exercise program by [facility].  However he is not satisfied with his progress.  He notes continued pain and lack of motion and weakness.  He would like to avoid surgery if possible. After discussion and examination I believe that he does have a labrum tear but does not have a significant rotator cuff tear.  He does have mechanical shoulder symptoms.  I do think it is worthwhile trying conservative management strategies.  Will refer to physical therapy to work on strengthening.  Also showed him a shelf shoulder reduction technique in case his shoulder dislocates in the interim. If not improved in 1 month next step should be MRI arthrogram for potential surgical planning.  He will let me know.  PDMP not reviewed this encounter. Orders Placed This Encounter  Procedures   [place] LIMITED JOINT SPACE STRUCTURES UP RIGHT(NO LINKED CHARGES)    Order Specific Question:   Reason for Exam (SYMPTOM  OR DIAGNOSIS REQUIRED)     Answer:   R shoulder pain    Order Specific Question:   Preferred imaging location?    Answer:   [place]   Ambulatory referral to Physical Therapy    Referral Priority:   Routine    Referral Type:   Physical Medicine    Referral Reason:   Specialty Services Required    Requested Specialty:   Physical Therapy    Number of Visits Requested:   1   No orders of the defined types were placed in this encounter.   Discussed warning signs or symptoms. Please see discharge instructions. Patient expresses understanding.   The above documentation has been reviewed and is accurate and complete Lynne Leader, M.D.

## 2021-01-06 ENCOUNTER — Other Ambulatory Visit: Payer: Self-pay | Admitting: Internal Medicine

## 2021-01-13 ENCOUNTER — Telehealth: Payer: Self-pay | Admitting: Family Medicine

## 2021-01-13 NOTE — Telephone Encounter (Signed)
Patient would like to have Dr Georgina Snell go ahead and order the  MRI Arthrogram that was discussed at his visit. He is still having a lot of pain and wanted to make sure there was nothing else going on.  Please advise.

## 2021-01-16 ENCOUNTER — Other Ambulatory Visit: Payer: Self-pay | Admitting: Physical Therapy

## 2021-01-16 DIAGNOSIS — S43014S Anterior dislocation of right humerus, sequela: Secondary | ICD-10-CM

## 2021-01-16 NOTE — Telephone Encounter (Signed)
R shoulder MRI arthrogram ordered to Timothy Brown.  They will call pt to schedule.  If pt returns call again, he can call [phone] to schedule.

## 2021-01-16 NOTE — Telephone Encounter (Signed)
Left msg making pt aware that MRi has been ordered & approved through insurance.

## 2021-01-16 NOTE — Telephone Encounter (Signed)
Pt calling back to check status of MRI Arthrogram.

## 2021-01-30 ENCOUNTER — Ambulatory Visit (INDEPENDENT_AMBULATORY_CARE_PROVIDER_SITE_OTHER): Payer: 59

## 2021-01-30 ENCOUNTER — Other Ambulatory Visit: Payer: Self-pay

## 2021-01-30 ENCOUNTER — Ambulatory Visit (INDEPENDENT_AMBULATORY_CARE_PROVIDER_SITE_OTHER): Payer: 59 | Admitting: Sports Medicine

## 2021-01-30 DIAGNOSIS — M25511 Pain in right shoulder: Secondary | ICD-10-CM | POA: Diagnosis not present

## 2021-01-30 DIAGNOSIS — S43014S Anterior dislocation of right humerus, sequela: Secondary | ICD-10-CM

## 2021-01-30 DIAGNOSIS — G8929 Other chronic pain: Secondary | ICD-10-CM

## 2021-01-30 NOTE — Assessment & Plan Note (Signed)
Injection for MR arthrography, further management per primary treating provider. 

## 2021-01-30 NOTE — Progress Notes (Addendum)
    Procedures performed today:    Procedure: Real-time Ultrasound Guided gadolinium contrast injection of right glenohumeral joint Device: Samsung [id number]  Verbal informed consent obtained.  Time-out conducted.  Noted no overlying erythema, induration, or other signs of local infection.  Skin prepped in a sterile fashion.  Local anesthesia: Topical Ethyl chloride.  With sterile technique and under real time ultrasound guidance: Noted normal-appearing joint with mild effusion as well as potential Hill-Sachs lesion, 1 cc kenalog 40, 1 cc lidocaine, 1 cc bupivacaine injected easily, syringe switched and 0.1 cc gadolinium injected, syringe again switched and 10 cc sterile saline used to fully distend the joint. Joint visualized and capsule seen distending confirming intra-articular placement of contrast material and medication. Completed without difficulty  Advised to call if fevers/chills, erythema, induration, drainage, or persistent bleeding.  Images permanently stored in PACS Impression: Technically successful ultrasound guided gadolinium contrast injection for MR arthrography.  Please see separate MR arthrogram report.  Independent interpretation of notes and tests performed by another provider:   None.  Brief History, Exam, Impression, and Recommendations:    Chronic right shoulder pain Injection for MR arthrography, further management per primary treating provider.    ___________________________________________ Gwen Her. Dianah Field, M.D., ABFM., CAQSM. Primary Care and [place] Instructor of [place] [facility]

## 2021-01-31 NOTE — Progress Notes (Signed)
[facility] Phone: [phone] Subjective:   Fontaine No, am serving as a scribe for Dr. Hulan Saas.  This visit occurred during the SARS-CoV-2 public health emergency.  Safety protocols were in place, including screening questions prior to the visit, additional usage of staff PPE, and extensive cleaning of exam room while observing appropriate contact time as indicated for disinfecting solutions.    I'm seeing this patient by the request  of:  Cletis Athens, MD  CC: Right shoulder pain  [id number]  01/03/2021 Saw Dr. Georgina Snell Assessment and Plan: 60 y.o. male with right shoulder pain and instability following a first shoulder dislocation about 6 weeks ago.  Since then patient has been treated conservatively with some limited home exercise program by [facility].  However he is not satisfied with his progress.  He notes continued pain and lack of motion and weakness.  He would like to avoid surgery if possible. After discussion and examination I believe that he does have a labrum tear but does not have a significant rotator cuff tear.  He does have mechanical shoulder symptoms.  I do think it is worthwhile trying conservative management strategies.  Will refer to physical therapy to work on strengthening.  Also showed him a shelf shoulder reduction technique in case his shoulder dislocates in the interim. If not improved in 1 month next step should be MRI arthrogram for potential surgical planning.  He will let me know.  Updated 02/01/2021 [name] is a 60 y.o. male coming in with complaint of R shoulder pain that is worsening. Pain with flexion, abduction. Patient was seen in ED for shoulder dislocation on 11/19/2020. Using Tylenol and IBU for pain relief. Denies any radiating symptoms.   History of cervical radiculopathy and spinal fusion. Numbness in finger tips bilaterally.  Reviewed patient's MR  arthrogram in great entirety today.  Patient noted does have a very large full-thickness tear of the supraspinatus with at least 2 cm of retraction.  Chronic Hill-Sachs deformity noted as well.    Past Medical History:  Diagnosis Date   GERD (gastroesophageal reflux disease)    TUMS   Gout    Shortness of breath    WITH EXERTION   Past Surgical History:  Procedure Laterality Date   ANTERIOR CERVICAL DECOMP/DISCECTOMY FUSION N/A 05/19/2012   Procedure: ANTERIOR CERVICAL DECOMPRESSION/DISCECTOMY FUSION 2 LEVELS;  Surgeon: Ophelia Charter, MD;  Location: [facility];  Service: Neurosurgery;  Laterality: N/A;  C56 C67 anterior cervical decompression with fusion interbody prothesis plating and bonegraft   BREAST LUMPECTOMY Left    LUMP ON NIPPLE @ 60 YRS OLD   COLONOSCOPY WITH PROPOFOL N/A 06/21/2016   Procedure: COLONOSCOPY WITH PROPOFOL;  Surgeon: Jonathon Bellows, MD;  Location: [facility];  Service: Endoscopy;  Laterality: N/A;   Social History   Socioeconomic History   Marital status: Single    Spouse name: Not on file   Number of children: Not on file   Years of education: Not on file   Highest education level: Not on file  Occupational History   Not on file  Tobacco Use   Smoking status: Never   Smokeless tobacco: Never  Substance and Sexual Activity   Alcohol use: Yes    Alcohol/week: 4.0 - 6.0 standard drinks    Types: 4 - 6 Cans of beer per week   Drug use: Yes    Types: Marijuana    Comment: 40  YRS AGO   Sexual activity: Not on file  Other Topics Concern   Not on file  Social History Narrative   Not on file   Social Determinants of Health   Financial Resource Strain: Not on file  Food Insecurity: Not on file  Transportation Needs: Not on file  Physical Activity: Not on file  Stress: Not on file  Social Connections: Not on file   No Known Allergies Family History  Problem Relation Age of Onset   Lung cancer Mother    Lung cancer Father      Current  Outpatient Medications (Cardiovascular):    sildenafil (VIAGRA) 100 MG tablet, TAKE 1 TABLET BY MOUTH AS DIRECTED AS NEEDED   losartan (COZAAR) 50 MG tablet, Take 1 tablet (50 mg total) by mouth daily.   Current Outpatient Medications (Analgesics):    aspirin EC 81 MG tablet, Take 81 mg by mouth daily.   allopurinol (ZYLOPRIM) 100 MG tablet, Take 1 tablet (100 mg total) by mouth daily.   Current Outpatient Medications (Other):    diazepam (VALIUM) 5 MG tablet, Take 1 tablet (5 mg total) by mouth every 6 (six) hours as needed.   doxycycline (VIBRA-TABS) 100 MG tablet, TAKE 1 TABLET BY MOUTH EVERY DAY   vitamin C (ASCORBIC ACID) 500 MG tablet, Take 500 mg by mouth daily.   cholecalciferol (VITAMIN D) 1000 UNITS tablet, Take 1,000 Units by mouth daily.   Reviewed prior external information including notes and imaging from  primary care provider As well as notes that were available from care everywhere and other healthcare systems.  As stated above reviewed Dr. Georgina Snell notes as well as the recent MRI.  Past medical history, social, surgical and family history all reviewed in electronic medical record.  No pertanent information unless stated regarding to the chief complaint.   Review of Systems:  No headache, visual changes, nausea, vomiting, diarrhea, constipation, dizziness, abdominal pain, skin rash, fevers, chills, night sweats, weight loss, swollen lymph nodes, body aches, joint swelling, chest pain, shortness of breath, mood changes. POSITIVE muscle aches  Objective  Blood pressure 128/82, pulse (!) 54, height 6' (1.829 m), weight 260 lb (117.9 kg), SpO2 98 %.   General: No apparent distress alert and oriented x3 mood and affect normal, dressed appropriately.  HEENT: Pupils equal, extraocular movements intact  Respiratory: Patient's speak in full sentences and does not appear short of breath  Cardiovascular: No lower extremity edema, non tender, no erythema  Gait normal with good  balance and coordination.  MSK: Patient does have limited range of motion of the neck noted.  Patient noted does on the right shoulder have tenderness noted over the lateral aspect of the shoulder.  No weakness noted of the rotator cuff noted with 3 out of 5 strength.    Impression and Recommendations:     The above documentation has been reviewed and is accurate and complete Lyndal Pulley, DO

## 2021-02-01 ENCOUNTER — Ambulatory Visit (INDEPENDENT_AMBULATORY_CARE_PROVIDER_SITE_OTHER): Payer: 59 | Admitting: Family Medicine

## 2021-02-01 ENCOUNTER — Ambulatory Visit (INDEPENDENT_AMBULATORY_CARE_PROVIDER_SITE_OTHER): Payer: 59

## 2021-02-01 ENCOUNTER — Encounter: Payer: Self-pay | Admitting: Family Medicine

## 2021-02-01 ENCOUNTER — Other Ambulatory Visit: Payer: Self-pay

## 2021-02-01 VITALS — BP 128/82 | HR 54 | Ht 72.0 in | Wt 260.0 lb

## 2021-02-01 DIAGNOSIS — G8929 Other chronic pain: Secondary | ICD-10-CM

## 2021-02-01 DIAGNOSIS — M25511 Pain in right shoulder: Secondary | ICD-10-CM

## 2021-02-01 DIAGNOSIS — M542 Cervicalgia: Secondary | ICD-10-CM | POA: Diagnosis not present

## 2021-02-01 NOTE — Patient Instructions (Addendum)
Dr. Barb Merino on way out I'm here if you have any questions

## 2021-02-01 NOTE — Assessment & Plan Note (Signed)
Chronic right shoulder and does have rotator cuff tear with full-thickness tear noted.Patient also does have retraction noted.  At this point with me not seen patient but unfortunately if anything has significant weakness I do feel that surgical intervention would be patient's best opportunity.  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  Answered patient's as well as his girlfriend's questions today.  Patient will follow up with me again as needed otherwise.  Total time reviewing his MRI today, previous notes by another provider and discussing treatment options greater than 31 minutes.

## 2021-02-06 ENCOUNTER — Other Ambulatory Visit: Payer: Self-pay | Admitting: Orthopedic Surgery

## 2021-02-07 ENCOUNTER — Other Ambulatory Visit: Payer: Self-pay

## 2021-02-07 ENCOUNTER — Encounter (HOSPITAL_BASED_OUTPATIENT_CLINIC_OR_DEPARTMENT_OTHER): Payer: Self-pay | Admitting: Orthopedic Surgery

## 2021-02-14 ENCOUNTER — Other Ambulatory Visit: Payer: Self-pay

## 2021-02-14 ENCOUNTER — Ambulatory Visit (HOSPITAL_BASED_OUTPATIENT_CLINIC_OR_DEPARTMENT_OTHER): Payer: 59 | Admitting: Certified Registered"

## 2021-02-14 ENCOUNTER — Ambulatory Visit (HOSPITAL_BASED_OUTPATIENT_CLINIC_OR_DEPARTMENT_OTHER)
Admission: RE | Admit: 2021-02-14 | Discharge: 2021-02-14 | Disposition: A | Discharge status: Home / Self Care | Payer: 59 | Source: Ambulatory Visit | Attending: Orthopedic Surgery | Admitting: Orthopedic Surgery

## 2021-02-14 ENCOUNTER — Encounter (HOSPITAL_BASED_OUTPATIENT_CLINIC_OR_DEPARTMENT_OTHER)
Admission: RE | Disposition: A | Discharge status: Home / Self Care | Payer: Self-pay | Source: Ambulatory Visit | Attending: Orthopedic Surgery

## 2021-02-14 ENCOUNTER — Encounter (HOSPITAL_BASED_OUTPATIENT_CLINIC_OR_DEPARTMENT_OTHER): Payer: Self-pay | Admitting: Orthopedic Surgery

## 2021-02-14 DIAGNOSIS — M1990 Unspecified osteoarthritis, unspecified site: Secondary | ICD-10-CM | POA: Insufficient documentation

## 2021-02-14 DIAGNOSIS — M75121 Complete rotator cuff tear or rupture of right shoulder, not specified as traumatic: Secondary | ICD-10-CM | POA: Insufficient documentation

## 2021-02-14 DIAGNOSIS — M7541 Impingement syndrome of right shoulder: Secondary | ICD-10-CM | POA: Insufficient documentation

## 2021-02-14 HISTORY — PX: SHOULDER ARTHROSCOPY WITH ROTATOR CUFF REPAIR AND SUBACROMIAL DECOMPRESSION: SHX5686

## 2021-02-14 HISTORY — DX: Rosacea, unspecified: L71.9

## 2021-02-14 HISTORY — DX: Anxiety disorder, unspecified: F41.9

## 2021-02-14 HISTORY — DX: Essential (primary) hypertension: I10

## 2021-02-14 SURGERY — SHOULDER ARTHROSCOPY WITH ROTATOR CUFF REPAIR AND SUBACROMIAL DECOMPRESSION
Anesthesia: General | Site: Shoulder | Laterality: Right

## 2021-02-14 MED ORDER — MIDAZOLAM HCL 2 MG/2ML IJ SOLN
2.0000 mg | Freq: Once | INTRAMUSCULAR | Status: AC
  Administered 2021-02-14: 1 mg via INTRAVENOUS

## 2021-02-14 MED ORDER — LIDOCAINE HCL (CARDIAC) PF 100 MG/5ML IV SOSY
PREFILLED_SYRINGE | INTRAVENOUS | Status: DC | PRN
  Administered 2021-02-14: 100 mg via INTRAVENOUS

## 2021-02-14 MED ORDER — BUPIVACAINE LIPOSOME 1.3 % IJ SUSP
INTRAMUSCULAR | Status: DC | PRN
  Administered 2021-02-14: 10 mL via PERINEURAL

## 2021-02-14 MED ORDER — ONDANSETRON HCL 4 MG/2ML IJ SOLN
INTRAMUSCULAR | Status: DC | PRN
  Administered 2021-02-14: 4 mg via INTRAVENOUS

## 2021-02-14 MED ORDER — MIDAZOLAM HCL 2 MG/2ML IJ SOLN
INTRAMUSCULAR | Status: AC
  Filled 2021-02-14: qty 2

## 2021-02-14 MED ORDER — OXYCODONE-ACETAMINOPHEN 5-325 MG PO TABS: 1.0000 | ORAL_TABLET | Freq: Four times a day (QID) | ORAL | 0 refills | Status: DC | PRN

## 2021-02-14 MED ORDER — ACETAMINOPHEN 500 MG PO TABS
ORAL_TABLET | ORAL | Status: AC
  Filled 2021-02-14: qty 2

## 2021-02-14 MED ORDER — FENTANYL CITRATE (PF) 100 MCG/2ML IJ SOLN
INTRAMUSCULAR | Status: AC
  Filled 2021-02-14: qty 2

## 2021-02-14 MED ORDER — PROPOFOL 10 MG/ML IV BOLUS
INTRAVENOUS | Status: DC | PRN
  Administered 2021-02-14: 200 mg via INTRAVENOUS

## 2021-02-14 MED ORDER — OXYCODONE HCL 5 MG PO TABS: 5.0000 mg | ORAL_TABLET | Freq: Once | ORAL | Status: DC | PRN

## 2021-02-14 MED ORDER — CEFAZOLIN SODIUM-DEXTROSE 2-4 GM/100ML-% IV SOLN
INTRAVENOUS | Status: AC
  Filled 2021-02-14: qty 100

## 2021-02-14 MED ORDER — FENTANYL CITRATE (PF) 100 MCG/2ML IJ SOLN
100.0000 ug | Freq: Once | INTRAMUSCULAR | Status: AC
  Administered 2021-02-14: 50 ug via INTRAVENOUS

## 2021-02-14 MED ORDER — SUGAMMADEX SODIUM 200 MG/2ML IV SOLN
INTRAVENOUS | Status: DC | PRN
  Administered 2021-02-14: 200 mg via INTRAVENOUS

## 2021-02-14 MED ORDER — SODIUM CHLORIDE 0.9 % IR SOLN
Status: DC | PRN
  Administered 2021-02-14: 6000 mL

## 2021-02-14 MED ORDER — BUPIVACAINE-EPINEPHRINE (PF) 0.5% -1:200000 IJ SOLN
INTRAMUSCULAR | Status: DC | PRN
  Administered 2021-02-14: 15 mL via PERINEURAL

## 2021-02-14 MED ORDER — CEFAZOLIN SODIUM-DEXTROSE 2-4 GM/100ML-% IV SOLN
2.0000 g | INTRAVENOUS | Status: AC
  Administered 2021-02-14: 2 g via INTRAVENOUS

## 2021-02-14 MED ORDER — LACTATED RINGERS IV SOLN: INTRAVENOUS | Status: DC

## 2021-02-14 MED ORDER — FENTANYL CITRATE (PF) 100 MCG/2ML IJ SOLN: 25.0000 ug | INTRAMUSCULAR | Status: DC | PRN

## 2021-02-14 MED ORDER — DEXAMETHASONE SODIUM PHOSPHATE 4 MG/ML IJ SOLN
INTRAMUSCULAR | Status: DC | PRN
  Administered 2021-02-14: 10 mg via INTRAVENOUS

## 2021-02-14 MED ORDER — ACETAMINOPHEN 500 MG PO TABS
1000.0000 mg | ORAL_TABLET | Freq: Once | ORAL | Status: AC
  Administered 2021-02-14: 1000 mg via ORAL

## 2021-02-14 MED ORDER — OXYCODONE HCL 5 MG/5ML PO SOLN: 5.0000 mg | Freq: Once | ORAL | Status: DC | PRN

## 2021-02-14 MED ORDER — FENTANYL CITRATE (PF) 100 MCG/2ML IJ SOLN
INTRAMUSCULAR | Status: DC | PRN
  Administered 2021-02-14: 100 ug via INTRAVENOUS

## 2021-02-14 MED ORDER — ROCURONIUM BROMIDE 100 MG/10ML IV SOLN
INTRAVENOUS | Status: DC | PRN
  Administered 2021-02-14: 60 mg via INTRAVENOUS

## 2021-02-14 MED ORDER — PROMETHAZINE HCL 25 MG/ML IJ SOLN: 6.2500 mg | INTRAMUSCULAR | Status: DC | PRN

## 2021-02-14 MED ORDER — DEXAMETHASONE SODIUM PHOSPHATE 10 MG/ML IJ SOLN
INTRAMUSCULAR | Status: AC
  Filled 2021-02-14: qty 1

## 2021-02-14 MED ORDER — ONDANSETRON HCL 4 MG/2ML IJ SOLN
INTRAMUSCULAR | Status: AC
  Filled 2021-02-14: qty 2

## 2021-02-14 SURGICAL SUPPLY — 54 items
ANCHOR PEEK 4.75X19.1 SWLK C (Anchor) ×4 IMPLANT
BENZOIN TINCTURE PRP APPL 2/3 (GAUZE/BANDAGES/DRESSINGS) IMPLANT
BURR OVAL 8 FLU 4.0X13 (MISCELLANEOUS) ×2 IMPLANT
CANNULA 5.75X7 CRYSTAL CLEAR (CANNULA) ×2 IMPLANT
CANNULA TWIST IN 8.25X7CM (CANNULA) ×1 IMPLANT
CHLORAPREP W/TINT 26 (MISCELLANEOUS) ×2 IMPLANT
CUTTER BONE 4.0MM X 13CM (MISCELLANEOUS) ×2 IMPLANT
DECANTER SPIKE VIAL GLASS SM (MISCELLANEOUS) IMPLANT
DRAPE IMP U-DRAPE 54X76 (DRAPES) ×2 IMPLANT
DRAPE INCISE IOBAN 66X45 STRL (DRAPES) ×2 IMPLANT
DRAPE STERI 35X30 U-POUCH (DRAPES) ×2 IMPLANT
DRAPE SURG 17X23 STRL (DRAPES) ×1 IMPLANT
DRAPE U-SHAPE 47X51 STRL (DRAPES) ×3 IMPLANT
DRAPE U-SHAPE 76X120 STRL (DRAPES) ×4 IMPLANT
DRSG PAD ABDOMINAL 8X10 ST (GAUZE/BANDAGES/DRESSINGS) ×2 IMPLANT
ELECT REM PT RETURN 9FT ADLT (ELECTROSURGICAL) ×2
ELECTRODE REM PT RTRN 9FT ADLT (ELECTROSURGICAL) ×1 IMPLANT
GAUZE 4X4 16PLY ~~LOC~~+RFID DBL (SPONGE) IMPLANT
GAUZE SPONGE 4X4 12PLY STRL (GAUZE/BANDAGES/DRESSINGS) ×2 IMPLANT
GAUZE XEROFORM 1X8 LF (GAUZE/BANDAGES/DRESSINGS) ×2 IMPLANT
GLOVE SRG 8 PF TXTR STRL LF DI (GLOVE) ×1 IMPLANT
GLOVE SURG ENC MOIS LTX SZ7.5 (GLOVE) ×2 IMPLANT
GLOVE SURG POLYISO LF SZ6.5 (GLOVE) ×2 IMPLANT
GLOVE SURG UNDER POLY LF SZ6.5 (GLOVE) ×2 IMPLANT
GLOVE SURG UNDER POLY LF SZ8 (GLOVE) ×1
GOWN STRL REUS W/ TWL LRG LVL3 (GOWN DISPOSABLE) ×2 IMPLANT
GOWN STRL REUS W/ TWL XL LVL3 (GOWN DISPOSABLE) ×1 IMPLANT
GOWN STRL REUS W/TWL LRG LVL3 (GOWN DISPOSABLE) ×2
GOWN STRL REUS W/TWL XL LVL3 (GOWN DISPOSABLE) ×1
LASSO CRESCENT QUICKPASS (SUTURE) IMPLANT
MANIFOLD NEPTUNE II (INSTRUMENTS) ×2 IMPLANT
NDL SCORPION MULTI FIRE (NEEDLE) IMPLANT
NEEDLE SCORPION MULTI FIRE (NEEDLE) ×2 IMPLANT
NS IRRIG 1000ML POUR BTL (IV SOLUTION) IMPLANT
PACK ARTHROSCOPY DSU (CUSTOM PROCEDURE TRAY) ×2 IMPLANT
PACK BASIN DAY SURGERY FS (CUSTOM PROCEDURE TRAY) ×2 IMPLANT
PROBE BIPOLAR ATHRO 135MM 90D (MISCELLANEOUS) ×2 IMPLANT
RESTRAINT HEAD UNIVERSAL NS (MISCELLANEOUS) ×2 IMPLANT
SLEEVE SCD COMPRESS KNEE MED (STOCKING) ×2 IMPLANT
SLING ARM FOAM STRAP LRG (SOFTGOODS) ×1 IMPLANT
STRIP CLOSURE SKIN 1/2X4 (GAUZE/BANDAGES/DRESSINGS) IMPLANT
SUPPORT WRAP ARM LG (MISCELLANEOUS) ×2 IMPLANT
SUT ETHILON 3 0 PS 1 (SUTURE) ×2 IMPLANT
SUT FIBERWIRE #2 38 T-5 BLUE (SUTURE)
SUT PDS AB 0 CT 36 (SUTURE) IMPLANT
SUT TIGER TAPE 7 IN WHITE (SUTURE) ×1 IMPLANT
SUTURE FIBERWR #2 38 T-5 BLUE (SUTURE) IMPLANT
SUTURE TAPE 1.3 40 TPR END (SUTURE) IMPLANT
SUTURETAPE 1.3 40 TPR END (SUTURE)
TAPE FIBER 2MM 7IN #2 BLUE (SUTURE) ×1 IMPLANT
TOWEL GREEN STERILE FF (TOWEL DISPOSABLE) ×2 IMPLANT
TUBE CONNECTING 20X1/4 (TUBING) ×2 IMPLANT
TUBING ARTHROSCOPY IRRIG 16FT (MISCELLANEOUS) ×2 IMPLANT
WATER STERILE IRR 1000ML POUR (IV SOLUTION) ×2 IMPLANT

## 2021-02-14 NOTE — Progress Notes (Signed)
Assisted Dr. Daiva Huge with right, ultrasound guided, interscalene  block. Side rails up, monitors on throughout procedure. See vital signs in flow sheet. Tolerated Procedure well.

## 2021-02-14 NOTE — Discharge Instructions (Addendum)
Discharge Instructions after Arthroscopic Shoulder Repair   A sling has been provided for you. Remain in your sling at all times. This includes sleeping in your sling.  Use ice on the shoulder intermittently over the first 48 hours after surgery.  Pain medicine has been prescribed for you.  Use your medicine liberally over the first 48 hours, and then you can begin to taper your use. You may take Extra Strength Tylenol or Tylenol only in place of the pain pills. DO NOT take ANY nonsteroidal anti-inflammatory pain medications: Advil, Motrin, Ibuprofen, Aleve, Naproxen, or Narprosyn.  You may remove your dressing after two days. If the incision sites are still moist, place a Band-Aid over the moist site(s). Change Band-Aids daily until dry.  You may shower 5 days after surgery. The incisions CANNOT get wet prior to 5 days. Simply allow the water to wash over the site and then pat dry. Do not rub the incisions. Make sure your axilla (armpit) is completely dry after showering.  Take one aspirin a day for 2 weeks after surgery, unless you have an aspirin sensitivity/ allergy or asthma.   Please call [phone] during normal business hours or [phone] after hours for any problems. Including the following:  - excessive redness of the incisions - drainage for more than 4 days - fever of more than 101.5 F  *Please note that pain medications will not be refilled after hours or on weekends.   Post Anesthesia Home Care Instructions  Activity: Get plenty of rest for the remainder of the day. A responsible individual must stay with you for 24 hours following the procedure.  For the next 24 hours, DO NOT: -Drive a car -Paediatric nurse -Drink alcoholic beverages -Take any medication unless instructed by your physician -Make any legal decisions or sign important papers.  Meals: Start with liquid foods such as gelatin or soup. Progress to regular foods as tolerated. Avoid greasy, spicy, heavy  foods. If nausea and/or vomiting occur, drink only clear liquids until the nausea and/or vomiting subsides. Call your physician if vomiting continues.  Special Instructions/Symptoms: Your throat may feel dry or sore from the anesthesia or the breathing tube placed in your throat during surgery. If this causes discomfort, gargle with warm salt water. The discomfort should disappear within 24 hours.  If you had a scopolamine patch placed behind your ear for the management of post- operative nausea and/or vomiting:  1. The medication in the patch is effective for 72 hours, after which it should be removed.  Wrap patch in a tissue and discard in the trash. Wash hands thoroughly with soap and water. 2. You may remove the patch earlier than 72 hours if you experience unpleasant side effects which may include dry mouth, dizziness or visual disturbances. 3. Avoid touching the patch. Wash your hands with soap and water after contact with the patch.    Regional Anesthesia Blocks  1. Numbness or the inability to move the "blocked" extremity may last from 3-48 hours after placement. The length of time depends on the medication injected and your individual response to the medication. If the numbness is not going away after 48 hours, call your surgeon.  2. The extremity that is blocked will need to be protected until the numbness is gone and the  Strength has returned. Because you cannot feel it, you will need to take extra care to avoid injury. Because it may be weak, you may have difficulty moving it or using it. You may not  know what position it is in without looking at it while the block is in effect.  3. For blocks in the legs and feet, returning to weight bearing and walking needs to be done carefully. You will need to wait until the numbness is entirely gone and the strength has returned. You should be able to move your leg and foot normally before you try and bear weight or walk. You will need someone to  be with you when you first try to ensure you do not fall and possibly risk injury.  4. Bruising and tenderness at the needle site are common side effects and will resolve in a few days.  5. Persistent numbness or new problems with movement should be communicated to the surgeon or the [facility] [phone] [facility] [phone]).   Information for Discharge Teaching: EXPAREL (bupivacaine liposome injectable suspension)   Your surgeon or anesthesiologist gave you EXPAREL(bupivacaine) to help control your pain after surgery.  EXPAREL is a local anesthetic that provides pain relief by numbing the tissue around the surgical site. EXPAREL is designed to release pain medication over time and can control pain for up to 72 hours. Depending on how you respond to EXPAREL, you may require less pain medication during your recovery.  Possible side effects: Temporary loss of sensation or ability to move in the area where bupivacaine was injected. Nausea, vomiting, constipation Rarely, numbness and tingling in your mouth or lips, lightheadedness, or anxiety may occur. Call your doctor right away if you think you may be experiencing any of these sensations, or if you have other questions regarding possible side effects.  Follow all other discharge instructions given to you by your surgeon or nurse. Eat a healthy diet and drink plenty of water or other fluids.  If you return to the hospital for any reason within 96 hours following the administration of EXPAREL, it is important for health care providers to know that you have received this anesthetic. A teal colored band has been placed on your arm with the date, time and amount of EXPAREL you have received in order to alert and inform your health care providers. Please leave this armband in place for the full 96 hours following administration, and then you may remove the band.   *May have Tylenol at 7:30pm today 02/14/21

## 2021-02-14 NOTE — Op Note (Signed)
Procedure(s): SHOULDER ARTHROSCOPY WITH ROTATOR CUFF REPAIR AND SUBACROMIAL DECOMPRESSION Procedure Note  KRISTOFFER BALA male 60 y.o. 02/14/2021   Preoperative diagnosis: Right shoulder traumatic rotator cuff tear, impingement  Procedure(s) and Anesthesia Type:    * SHOULDER ARTHROSCOPY WITH ROTATOR CUFF REPAIR AND SUBACROMIAL DECOMPRESSION - General  Surgeon(s) and Role:    Tania Ade, MD - Primary     Surgeon: Rhae Hammock   Assistants: Sheryle Hail PA-C Amber was present and scrubbed throughout the procedure and was essential in positioning, assisting with the camera and instrumentation,, and closure)  Anesthesia: General endotracheal anesthesia with preoperative interscalene block given by the attending anesthesiologist   Procedure Detail  SHOULDER ARTHROSCOPY WITH ROTATOR CUFF REPAIR AND SUBACROMIAL DECOMPRESSION  Estimated Blood Loss: Min         Drains: none  Blood Given: none         Specimens: none        Complications:  * No complications entered in OR log *         Disposition: PACU - hemodynamically stable.         Condition: stable    Procedure:   INDICATIONS FOR SURGERY: The patient is 60 y.o. male who suffered a right shoulder dislocation with rotator cuff tear.  He went on to have continued pain and weakness and was indicated for surgical treatment to decrease pain and restore function.  OPERATIVE FINDINGS: Examination under anesthesia: No significant stiffness or instability noted   DESCRIPTION OF PROCEDURE: The patient was identified in preoperative  holding area where I personally marked the operative site after  verifying site, side, and procedure with the patient. An interscalene block was given by the attending anesthesiologist the holding area.  The patient was taken back to the operating room where general anesthesia was induced without complication and was placed in the beach-chair position with the back  elevated about  60 degrees and all extremities and head and neck carefully padded and  positioned.   The right upper extremity was then prepped and  draped in a standard sterile fashion. The appropriate time-out  procedure was carried out. The patient did receive IV antibiotics  within 30 minutes of incision.   A small posterior portal incision was made and the arthroscope was introduced into the joint. An anterior portal was then established above the subscapularis using needle localization. Small cannula was placed anteriorly. Diagnostic arthroscopy was then carried out .  The subscapularis was noted to be intact.  The biceps tendon was pulled into the joint and had significant partial tearing involving more than 50% of the thickness of the tendon.  The biceps origin was also compromised with elevation of the superior labrum.  Given these findings the biceps tendon was released from the superior labrum with the ArthroCare.  The joint surfaces were healthy appearing without chondromalacia.  The supraspinatus was noted to be completely torn with retraction to the mid humeral head level.  The arthroscope was then introduced into the subacromial space a standard lateral portal was established with needle localization. The shaver was used through the lateral portal to perform extensive bursectomy.   The bursal surface of the rotator cuff tear was identified and debrided back to healthy tendon.  It was easily reducible over the tuberosity.  Therefore the repair was carried out by first debriding the tuberosity down to a bleeding surface to promote healing and then placing 2 4.75 peek swivel lock anchors just off the articular margin each  loaded with a fiber tape.  These 4 suture strands were then passed evenly throughout the tear and brought over in a crossing suture bridge pattern to 2 additional anchors bringing the tendon down nicely over the prepared tuberosity in a smooth even fashion.  The repair was watertight and  without undue tension.  The coracoacromial ligament was taken down off the anterior acromion with the ArthroCare exposing a moderate hooked anterior acromial spur. A high-speed bur was then used through the lateral portal to take down the anterior acromial spur from lateral to medial in a standard acromioplasty.  The acromioplasty was also viewed from the lateral portal and the bur was used as necessary to ensure that the acromion was completely flat from posterior to anterior.  The arthroscopic equipment was removed from the joint and the portals were closed with 3-0 nylon in an interrupted fashion. Sterile dressings were then applied including Xeroform 4 x 4's ABDs and tape. The patient was then allowed to awaken from general anesthesia, placed in a sling, transferred to the stretcher and taken to the recovery room in stable condition.   POSTOPERATIVE PLAN: The patient will be discharged home today and will followup in one week for suture removal and wound check.

## 2021-02-14 NOTE — Anesthesia Preprocedure Evaluation (Addendum)
Anesthesia Evaluation  Patient identified by MRN, date of birth, ID band Patient awake    Reviewed: Allergy & Precautions, NPO status , Patient's Chart, lab work & pertinent test results  History of Anesthesia Complications Negative for: history of anesthetic complications  Airway Mallampati: II  TM Distance: >3 FB Neck ROM: Full    Dental no notable dental hx.    Pulmonary neg pulmonary ROS,    Pulmonary exam normal        Cardiovascular hypertension, Normal cardiovascular exam     Neuro/Psych Anxiety S/P ACDF 2014    GI/Hepatic Neg liver ROS, GERD  Medicated and Controlled,  Endo/Other  negative endocrine ROS  Renal/GU negative Renal ROS  negative genitourinary   Musculoskeletal  (+) Arthritis ,   Abdominal   Peds  Hematology negative hematology ROS (+)   Anesthesia Other Findings Day of surgery medications reviewed with patient.  Reproductive/Obstetrics negative OB ROS                            Anesthesia Physical Anesthesia Plan  ASA: 2  Anesthesia Plan: General   Post-op Pain Management: Regional block and Tylenol PO (pre-op)   Induction: Intravenous  PONV Risk Score and Plan: 2 and Treatment may vary due to age or medical condition, Midazolam, Ondansetron and Dexamethasone  Airway Management Planned: Oral ETT  Additional Equipment: None  Intra-op Plan:   Post-operative Plan: Extubation in OR  Informed Consent: I have reviewed the patients History and Physical, chart, labs and discussed the procedure including the risks, benefits and alternatives for the proposed anesthesia with the patient or authorized representative who has indicated his/her understanding and acceptance.     Dental advisory given  Plan Discussed with: CRNA  Anesthesia Plan Comments:        Anesthesia Quick Evaluation

## 2021-02-14 NOTE — H&P (Signed)
Timothy Brown is an 60 y.o. male.   Chief Complaint: R shoulder pain and dysfunction HPI: R shoulder rotator cuff tear/ impingement, failed conservative treatment.  Past Medical History:  Diagnosis Date   Anxiety    GERD (gastroesophageal reflux disease)    TUMS   Gout    Hypertension    no meds now   Rosacea    Shortness of breath    WITH EXERTION    Past Surgical History:  Procedure Laterality Date   ANTERIOR CERVICAL DECOMP/DISCECTOMY FUSION N/A 05/19/2012   Procedure: ANTERIOR CERVICAL DECOMPRESSION/DISCECTOMY FUSION 2 LEVELS;  Surgeon: Ophelia Charter, MD;  Location: [place] [facility];  Service: Neurosurgery;  Laterality: N/A;  C56 C67 anterior cervical decompression with fusion interbody prothesis plating and bonegraft   BREAST LUMPECTOMY Left    LUMP ON NIPPLE @ 60 YRS OLD   COLONOSCOPY WITH PROPOFOL N/A 06/21/2016   Procedure: COLONOSCOPY WITH PROPOFOL;  Surgeon: Jonathon Bellows, MD;  Location: [facility];  Service: Endoscopy;  Laterality: N/A;    Family History  Problem Relation Age of Onset   Lung cancer Mother    Lung cancer Father    Social History:  reports that he has never smoked. He has never used smokeless tobacco. He reports current alcohol use of about 4.0 - 6.0 standard drinks per week. He reports current drug use. Drug: Marijuana.  Allergies: No Known Allergies  Medications Prior to Admission  Medication Sig Dispense Refill   aspirin EC 81 MG tablet Take 81 mg by mouth daily.     cholecalciferol (VITAMIN D) 1000 UNITS tablet Take 1,000 Units by mouth daily.     diazepam (VALIUM) 5 MG tablet Take 1 tablet (5 mg total) by mouth every 6 (six) hours as needed. 30 tablet 1   doxycycline (VIBRA-TABS) 100 MG tablet TAKE 1 TABLET BY MOUTH EVERY DAY (Patient taking differently: No sig reported) 30 tablet 3   sildenafil (VIAGRA) 100 MG tablet TAKE 1 TABLET BY MOUTH AS DIRECTED AS NEEDED 4 tablet 8   vitamin C (ASCORBIC ACID) 500 MG tablet Take 500 mg by mouth daily.       No results found for this or any previous visit (from the past 48 hour(s)). No results found.  Review of Systems  All other systems reviewed and are negative.  Blood pressure (!) 147/81, pulse (!) 58, temperature (!) 97 F (36.1 C), temperature source Oral, resp. rate 16, height 6' (1.829 m), weight 112.6 kg, SpO2 97 %. Physical Exam Constitutional:      Appearance: He is well-developed.  HENT:     Head: Atraumatic.  Eyes:     Extraocular Movements: Extraocular movements intact.  Cardiovascular:     Pulses: Normal pulses.  Pulmonary:     Effort: Pulmonary effort is normal.  Musculoskeletal:     Comments: RUE pain with RC testing  Skin:    General: Skin is warm and dry.  Neurological:     Mental Status: He is alert and oriented to person, place, and time.  Psychiatric:        Mood and Affect: Mood normal.     Assessment/Plan R shoulder rotator cuff tear/ impingement Plan R arth RCR, SAD Risks / benefits of surgery discussed Consent on chart  NPO for OR Preop antibiotics   Rhae Hammock, MD 02/14/2021, 1:35 PM

## 2021-02-14 NOTE — Anesthesia Procedure Notes (Signed)
Anesthesia Regional Block: Interscalene brachial plexus block   Pre-Anesthetic Checklist: , timeout performed,  Correct Patient, Correct Site, Correct Laterality,  Correct Procedure, Correct Position, site marked,  Risks and benefits discussed,  Pre-op evaluation,  At surgeon's request and post-op pain management  Laterality: Right  Prep: Maximum Sterile Barrier Precautions used, chloraprep       Needles:  Injection technique: Single-shot  Needle Type: Echogenic Stimulator Needle     Needle Length: 9cm  Needle Gauge: 22     Additional Needles:   Procedures:,,,, ultrasound used (permanent image in chart),,    Narrative:  Start time: 02/14/2021 1:35 PM End time: 02/14/2021 1:38 PM Injection made incrementally with aspirations every 5 mL.  Performed by: Personally  Anesthesiologist: Brennan Bailey, MD  Additional Notes: Risks, benefits, and alternative discussed. Patient gave consent for procedure. Patient prepped and draped in sterile fashion. Sedation administered, patient remains easily responsive to voice. Relevant anatomy identified with ultrasound guidance. Local anesthetic given in 5cc increments with no signs or symptoms of intravascular injection. No pain or paraesthesias with injection. Patient monitored throughout procedure with signs of LAST or immediate complications. Tolerated well. Ultrasound image placed in chart.  Tawny Asal, MD

## 2021-02-14 NOTE — Anesthesia Procedure Notes (Addendum)
Procedure Name: Intubation Date/Time: 02/14/2021 1:55 PM Performed by: Ezequiel Kayser, CRNA Pre-anesthesia Checklist: Patient identified, Emergency Drugs available, Suction available and Patient being monitored Patient Re-evaluated:Patient Re-evaluated prior to induction Oxygen Delivery Method: Circle System Utilized Preoxygenation: Pre-oxygenation with 100% oxygen Induction Type: IV induction Ventilation: Mask ventilation without difficulty and Oral airway inserted - appropriate to patient size Laryngoscope Size: Mac and 4 Grade View: Grade I Tube type: Oral Tube size: 7.0 mm Number of attempts: 1 Airway Equipment and Method: Stylet and Oral airway Placement Confirmation: ETT inserted through vocal cords under direct vision, positive ETCO2 and breath sounds checked- equal and bilateral Secured at: 22 cm Tube secured with: Tape Dental Injury: Teeth and Oropharynx as per pre-operative assessment

## 2021-02-14 NOTE — Transfer of Care (Addendum)
Immediate Anesthesia Transfer of Care Note  Patient: Timothy Brown  Procedure(s) Performed: SHOULDER ARTHROSCOPY WITH ROTATOR CUFF REPAIR AND SUBACROMIAL DECOMPRESSION (Right: Shoulder)  Patient Location: PACU  Anesthesia Type:General and Regional  Level of Consciousness: drowsy  Airway & Oxygen Therapy: Patient Spontanous Breathing and Patient connected to face mask oxygen  Post-op Assessment: Report given to RN and Post -op Vital signs reviewed and stable  Post vital signs: Reviewed and stable  Last Vitals:  Vitals Value Taken Time  BP 138/88 02/14/21 1510  Temp 36.4 C 02/14/21 1510  Pulse 66 02/14/21 1515  Resp 14 02/14/21 1515  SpO2 96 % 02/14/21 1515  Vitals shown include unvalidated device data.  Last Pain:  Vitals:   02/14/21 1510  TempSrc:   PainSc: 0-No pain      Patients Stated Pain Goal: 5 (93/79/02 4097)  Complications: No notable events documented.

## 2021-02-14 NOTE — Anesthesia Postprocedure Evaluation (Signed)
Anesthesia Post Note  Patient: Timothy Brown  Procedure(s) Performed: SHOULDER ARTHROSCOPY WITH ROTATOR CUFF REPAIR AND SUBACROMIAL DECOMPRESSION (Right: Shoulder)     Patient location during evaluation: PACU Anesthesia Type: General Level of consciousness: awake and alert and oriented Pain management: pain level controlled Vital Signs Assessment: post-procedure vital signs reviewed and stable Respiratory status: spontaneous breathing, nonlabored ventilation and respiratory function stable Cardiovascular status: blood pressure returned to baseline Postop Assessment: no apparent nausea or vomiting Anesthetic complications: no   No notable events documented.  Last Vitals:  Vitals:   02/14/21 1545 02/14/21 1600  BP: 130/82 (!) 147/92  Pulse: (!) 59 60  Resp: 16 16  Temp:  36.6 C  SpO2: 94% 94%    Last Pain:  Vitals:   02/14/21 1600  TempSrc:   PainSc: 0-No pain                 Marthenia Rolling

## 2021-02-16 ENCOUNTER — Encounter (HOSPITAL_BASED_OUTPATIENT_CLINIC_OR_DEPARTMENT_OTHER): Payer: Self-pay | Admitting: Orthopedic Surgery

## 2021-06-28 DIAGNOSIS — S46011A Strain of muscle(s) and tendon(s) of the rotator cuff of right shoulder, initial encounter: Secondary | ICD-10-CM | POA: Diagnosis not present

## 2021-08-21 ENCOUNTER — Ambulatory Visit (INDEPENDENT_AMBULATORY_CARE_PROVIDER_SITE_OTHER): Payer: 59 | Admitting: Internal Medicine

## 2021-08-21 ENCOUNTER — Encounter: Payer: Self-pay | Admitting: Internal Medicine

## 2021-08-21 VITALS — BP 134/85 | HR 61 | Ht 72.0 in | Wt 253.1 lb

## 2021-08-21 DIAGNOSIS — F419 Anxiety disorder, unspecified: Secondary | ICD-10-CM | POA: Diagnosis not present

## 2021-08-21 DIAGNOSIS — Z6835 Body mass index (BMI) 35.0-35.9, adult: Secondary | ICD-10-CM | POA: Diagnosis not present

## 2021-08-21 DIAGNOSIS — R69 Illness, unspecified: Secondary | ICD-10-CM | POA: Diagnosis not present

## 2021-08-21 DIAGNOSIS — M25511 Pain in right shoulder: Secondary | ICD-10-CM

## 2021-08-21 DIAGNOSIS — L719 Rosacea, unspecified: Secondary | ICD-10-CM | POA: Diagnosis not present

## 2021-08-21 DIAGNOSIS — E6609 Other obesity due to excess calories: Secondary | ICD-10-CM

## 2021-08-21 DIAGNOSIS — G8929 Other chronic pain: Secondary | ICD-10-CM | POA: Diagnosis not present

## 2021-08-21 DIAGNOSIS — I10 Essential (primary) hypertension: Secondary | ICD-10-CM | POA: Diagnosis not present

## 2021-08-21 MED ORDER — DIAZEPAM 5 MG PO TABS: 5.0000 mg | ORAL_TABLET | Freq: Four times a day (QID) | ORAL | 1 refills | Status: DC | PRN

## 2021-08-21 MED ORDER — CLOBETASOL PROPIONATE 0.05 % EX SOLN: 1.0000 | Freq: Two times a day (BID) | CUTANEOUS | 2 refills | Status: AC | PRN

## 2021-08-21 MED ORDER — CLOBETASOL PROPIONATE 0.05 % EX SOLN: 1.0000 | Freq: Two times a day (BID) | CUTANEOUS | 2 refills | Status: DC | PRN

## 2021-08-21 NOTE — Progress Notes (Signed)
Established Patient Office Visit  Subjective:  Patient ID: Timothy Brown, male    DOB: May 11, 1960  Age: 61 y.o. MRN: [id number]  CC:  Chief Complaint  Patient presents with   Medication Refill    Patient is requesting a refill on a medication from dermatology.    Medication Refill    Timothy Brown presents for dermatitis   Past Medical History:  Diagnosis Date   Anxiety    GERD (gastroesophageal reflux disease)    TUMS   Gout    Hypertension    no meds now   Rosacea    Shortness of breath    WITH EXERTION    Past Surgical History:  Procedure Laterality Date   ANTERIOR CERVICAL DECOMP/DISCECTOMY FUSION N/A 05/19/2012   Procedure: ANTERIOR CERVICAL DECOMPRESSION/DISCECTOMY FUSION 2 LEVELS;  Surgeon: Ophelia Charter, MD;  Location: [facility];  Service: Neurosurgery;  Laterality: N/A;  C56 C67 anterior cervical decompression with fusion interbody prothesis plating and bonegraft   BREAST LUMPECTOMY Left    LUMP ON NIPPLE @ 61 YRS OLD   COLONOSCOPY WITH PROPOFOL N/A 06/21/2016   Procedure: COLONOSCOPY WITH PROPOFOL;  Surgeon: Jonathon Bellows, MD;  Location: [facility];  Service: Endoscopy;  Laterality: N/A;   SHOULDER ARTHROSCOPY WITH ROTATOR CUFF REPAIR AND SUBACROMIAL DECOMPRESSION Right 02/14/2021   Procedure: SHOULDER ARTHROSCOPY WITH ROTATOR CUFF REPAIR AND SUBACROMIAL DECOMPRESSION;  Surgeon: Tania Ade, MD;  Location: [place];  Service: Orthopedics;  Laterality: Right;    Family History  Problem Relation Age of Onset   Lung cancer Mother    Lung cancer Father     Social History   Socioeconomic History   Marital status: Single    Spouse name: Not on file   Number of children: Not on file   Years of education: Not on file   Highest education level: Not on file  Occupational History   Not on file  Tobacco Use   Smoking status: Never   Smokeless tobacco: Never  Substance and Sexual Activity   Alcohol use: Yes    Alcohol/week:  4.0 - 6.0 standard drinks of alcohol    Types: 4 - 6 Cans of beer per week    Comment: social   Drug use: Yes    Types: Marijuana    Comment: 40 YRS AGO   Sexual activity: Yes  Other Topics Concern   Not on file  Social History Narrative   Not on file   Social Determinants of Health   Financial Resource Strain: Not on file  Food Insecurity: Not on file  Transportation Needs: Not on file  Physical Activity: Not on file  Stress: Not on file  Social Connections: Not on file  Intimate Partner Violence: Not on file     Current Outpatient Medications:    aspirin EC 81 MG tablet, Take 81 mg by mouth daily., Disp: , Rfl:    doxycycline (VIBRA-TABS) 100 MG tablet, TAKE 1 TABLET BY MOUTH EVERY DAY (Patient taking differently: No sig reported), Disp: 30 tablet, Rfl: 3   sildenafil (VIAGRA) 100 MG tablet, TAKE 1 TABLET BY MOUTH AS DIRECTED AS NEEDED, Disp: 4 tablet, Rfl: 8   vitamin C (ASCORBIC ACID) 500 MG tablet, Take 500 mg by mouth daily., Disp: , Rfl:    clobetasol (TEMOVATE) 0.05 % external solution, Apply 1 Application topically 2 (two) times daily as needed., Disp: 50 mL, Rfl: 2   diazepam (VALIUM) 5 MG tablet, Take 1 tablet (5 mg  total) by mouth every 6 (six) hours as needed., Disp: 30 tablet, Rfl: 1   No Known Allergies  ROS Review of Systems  Constitutional: Negative.   HENT: Negative.    Eyes: Negative.   Respiratory: Negative.    Cardiovascular: Negative.   Gastrointestinal: Negative.   Endocrine: Negative.   Genitourinary: Negative.   Musculoskeletal: Negative.   Skin: Negative.   Allergic/Immunologic: Negative.   Neurological: Negative.   Hematological: Negative.   Psychiatric/Behavioral: Negative.    All other systems reviewed and are negative.     Objective:    Physical Exam Vitals reviewed.  Constitutional:      Appearance: Normal appearance.  HENT:     Mouth/Throat:     Mouth: Mucous membranes are moist.  Eyes:     Pupils: Pupils are equal,  round, and reactive to light.  Neck:     Vascular: No carotid bruit.  Cardiovascular:     Rate and Rhythm: Normal rate and regular rhythm.     Pulses: Normal pulses.     Heart sounds: Normal heart sounds.  Pulmonary:     Effort: Pulmonary effort is normal.     Breath sounds: Normal breath sounds.  Abdominal:     General: Bowel sounds are normal.     Palpations: Abdomen is soft. There is no hepatomegaly, splenomegaly or mass.     Tenderness: There is no abdominal tenderness.     Hernia: No hernia is present.  Musculoskeletal:     Cervical back: Neck supple.     Right lower leg: No edema.     Left lower leg: No edema.  Skin:    Findings: No rash.  Neurological:     Mental Status: He is alert and oriented to person, place, and time.     Motor: No weakness.  Psychiatric:        Mood and Affect: Mood normal.        Behavior: Behavior normal.     BP 134/85   Pulse 61   Ht 6' (1.829 m)   Wt 253 lb 1.6 oz (114.8 kg)   BMI 34.33 kg/m  Wt Readings from Last 3 Encounters:  08/21/21 253 lb 1.6 oz (114.8 kg)  02/14/21 248 lb 3.8 oz (112.6 kg)  02/01/21 260 lb (117.9 kg)     Health Maintenance Due  Topic Date Due   COVID-19 Vaccine (1) Never done   HIV Screening  Never done   Hepatitis C Screening  Never done   TETANUS/TDAP  Never done   Zoster Vaccines- Shingrix (1 of 2) Never done   COLONOSCOPY (Pts 45-16yr Insurance coverage will need to be confirmed)  06/21/2021    There are no preventive care reminders to display for this patient.  No results found for: "TSH" Lab Results  Component Value Date   WBC 8.4 11/19/2020   HGB 14.9 11/19/2020   HCT 42.9 11/19/2020   MCV 97.7 11/19/2020   PLT 154 11/19/2020   Lab Results  Component Value Date   NA 136 11/19/2020   K 4.0 11/19/2020   CO2 23 11/19/2020   GLUCOSE 162 (H) 11/19/2020   BUN 18 11/19/2020   CREATININE 1.00 11/19/2020   CALCIUM 8.9 11/19/2020   ANIONGAP 11 11/19/2020   No results found for:  "CHOL" No results found for: "HDL" No results found for: "LDLCALC" No results found for: "TRIG" No results found for: "CHOLHDL" No results found for: "HGBA1C"    Assessment & Plan:   Problem List  Items Addressed This Visit       Cardiovascular and Mediastinum   HTN (hypertension)    Okay Citalopram CIWA 30 up to 30 days if not better we will refer him to see his psychiatrist of his use of 101 mg at instruction. The blood Goldwasser's Bilderback for We will the surgeon shoulderThese r patient denies any chest pain or shortness of breath there is no history of palpitation or paroxysmal nocturnal dyspnea   patient was advised to follow low-salt low-cholesterol diet    ideally I want to keep systolic blood pressure below 130 mmHg, patient was asked to check blood pressure one times a week and give me a report on that.  Patient will be follow-up in 3 months  or earlier as needed, patient will call me back for any change in the cardiovascular symptoms Patient was advised to buy a book from local bookstore concerning blood pressure and read several chapters  every day.  This will be supplemented by some of the material we will give him from the office.  Patient should also utilize other resources like YouTube and Internet to learn more about the blood pressure and the diet.        Musculoskeletal and Integument   Rosacea - Primary    Refer to dermatology      Relevant Medications   clobetasol (TEMOVATE) 0.05 % external solution     Other   Anxiety     Keeping a stress/anxiety diary. This can help you learn what triggers your reaction and then learn ways to manage your response.  Thinking about how you react to certain situations. You may not be able to control everything, but you can control your response.  Making time for activities that help you relax and not feeling guilty about spending your time in this way.  Visual imagery and yoga can help you stay calm and relax.       Relevant Medications   diazepam (VALIUM) 5 MG tablet   Class 2 obesity due to excess calories without serious comorbidity with body mass index (BMI) of 35.0 to 35.9 in adult    - I encouraged the patient to lose weight.  - I educated them on making healthy dietary choices including eating more fruits and vegetables and less fried foods. - I encouraged the patient to exercise more, and educated on the benefits of exercise including weight loss, diabetes prevention, and hypertension prevention.   Dietary counseling with a registered dietician  Referral to a weight management support group (e.g. Weight Watchers, Overeaters Anonymous)  Patient has lost about 40 pounds of weight  If your BMI is greater than 29 or you have gained more than 15 pounds you should work on weight loss.  Attend a healthy cooking class       Chronic right shoulder pain    Patient is doing well after surgery on the right shoulder       Meds ordered this encounter  Medications   clobetasol (TEMOVATE) 0.05 % external solution    Sig: Apply 1 Application topically 2 (two) times daily as needed.    Dispense:  50 mL    Refill:  2   diazepam (VALIUM) 5 MG tablet    Sig: Take 1 tablet (5 mg total) by mouth every 6 (six) hours as needed.    Dispense:  30 tablet    Refill:  1    This request is for a new prescription for a controlled substance as required  by Federal/State law.    Follow-up: No follow-ups on file.    Cletis Athens, MD

## 2021-08-21 NOTE — Assessment & Plan Note (Signed)
Okay Citalopram CIWA 30 up to 30 days if not better we will refer him to see his psychiatrist of his use of 101 mg at instruction. The blood Goldwasser's Bilderback for We will the surgeon shoulderThese r patient denies any chest pain or shortness of breath there is no history of palpitation or paroxysmal nocturnal dyspnea   patient was advised to follow low-salt low-cholesterol diet    ideally I want to keep systolic blood pressure below 130 mmHg, patient was asked to check blood pressure one times a week and give me a report on that.  Patient will be follow-up in 3 months  or earlier as needed, patient will call me back for any change in the cardiovascular symptoms Patient was advised to buy a book from local bookstore concerning blood pressure and read several chapters  every day.  This will be supplemented by some of the material we will give him from the office.  Patient should also utilize other resources like YouTube and Internet to learn more about the blood pressure and the diet.

## 2021-08-21 NOTE — Assessment & Plan Note (Signed)
   Keeping a stress/anxiety diary. This can help you learn what triggers your reaction and then learn ways to manage your response.  Thinking about how you react to certain situations. You may not be able to control everything, but you can control your response.  Making time for activities that help you relax and not feeling guilty about spending your time in this way.  Visual imagery and yoga can help you stay calm and relax. 

## 2021-08-21 NOTE — Assessment & Plan Note (Signed)
Refer to dermatology 

## 2021-08-21 NOTE — Assessment & Plan Note (Signed)
Patient is doing well after surgery on the right shoulder

## 2021-08-21 NOTE — Assessment & Plan Note (Addendum)
-   I encouraged the patient to lose weight.  - I educated them on making healthy dietary choices including eating more fruits and vegetables and less fried foods. - I encouraged the patient to exercise more, and educated on the benefits of exercise including weight loss, diabetes prevention, and hypertension prevention.   Dietary counseling with a registered dietician  Referral to a weight management support group (e.g. Weight Watchers, Overeaters Anonymous)  Patient has lost about 40 pounds of weight  If your BMI is greater than 29 or you have gained more than 15 pounds you should work on weight loss.  Attend a healthy cooking class

## 2021-09-14 ENCOUNTER — Ambulatory Visit: Payer: 59 | Admitting: Gastroenterology

## 2021-09-14 VITALS — BP 111/68 | HR 64 | Temp 97.9°F | Ht 72.0 in | Wt 252.0 lb

## 2021-09-14 DIAGNOSIS — R1319 Other dysphagia: Secondary | ICD-10-CM | POA: Diagnosis not present

## 2021-09-14 DIAGNOSIS — K5909 Other constipation: Secondary | ICD-10-CM

## 2021-09-14 NOTE — Progress Notes (Signed)
Jonathon Bellows MD, MRCP(U.K) [address]  [place]  [place], [place] [address]  Main: [phone]  Fax: [phone]   Primary Care Physician: Cletis Athens, MD  Primary Gastroenterologist:  Dr. Jonathon Bellows   Chief Complaint  Patient presents with   Follow-up    HPI: Timothy Brown is a 61 y.o. male   Summary of history :  Previously seen by Dr. Bonna Gains in 11/23/2019 for dysphagia which has been ongoing for 20 years for solids.  Colonoscopy in 2018 by myself showed 1 tubular adenoma recommended repeat in 5 years.  He was suggested to undergo an EGD but was not done back then due to insurance and being out of network.  Today he is here today to see me for constipation.  Interval history   11/23/2019-09/14/2021  He says the main reason he is here today to see me is for severe constipation.  He has suffered from constipation for many years.  Can go up to 5 days without a bowel movement which is usually very large.  Thought it was normal but now has a girlfriend who told him that he needs to get checked out sometimes sees blood when he stresses too much.  No change in the shape of the stool.  Tried MiraLAX taking up to 4 bottles at a time which has not helped with his constipation.  Not tried any other agents.  His dysphagia persists for certain foods he chops his foods.  He is small which goes down eventually denies any allergies.  Current Outpatient Medications  Medication Sig Dispense Refill   aspirin EC 81 MG tablet Take 81 mg by mouth daily.     clobetasol (TEMOVATE) 0.05 % external solution Apply 1 Application topically 2 (two) times daily as needed. 50 mL 2   diazepam (VALIUM) 5 MG tablet Take 1 tablet (5 mg total) by mouth every 6 (six) hours as needed. 30 tablet 1   doxycycline (VIBRA-TABS) 100 MG tablet TAKE 1 TABLET BY MOUTH EVERY DAY (Patient taking differently: No sig reported) 30 tablet 3   sildenafil (VIAGRA) 100 MG tablet TAKE 1 TABLET BY MOUTH AS DIRECTED AS  NEEDED 4 tablet 8   vitamin C (ASCORBIC ACID) 500 MG tablet Take 500 mg by mouth daily.     No current facility-administered medications for this visit.    Allergies as of 09/14/2021   (No Known Allergies)    ROS:  General: Negative for anorexia, weight loss, fever, chills, fatigue, weakness. ENT: Negative for hoarseness, difficulty swallowing , nasal congestion. CV: Negative for chest pain, angina, palpitations, dyspnea on exertion, peripheral edema.  Respiratory: Negative for dyspnea at rest, dyspnea on exertion, cough, sputum, wheezing.  GI: See history of present illness. GU:  Negative for dysuria, hematuria, urinary incontinence, urinary frequency, nocturnal urination.  Endo: Negative for unusual weight change.    Physical Examination:   BP 111/68   Pulse 64   Temp 97.9 F (36.6 C) (Oral)   Ht 6' (1.829 m)   Wt 252 lb (114.3 kg)   BMI 34.18 kg/m   General: Well-nourished, well-developed in no acute distress.  Eyes: No icterus. Conjunctivae pink. Neuro: Alert and oriented x 3.  Grossly intact. Skin: Warm and dry, no jaundice.   Psych: Alert and cooperative, normal mood and affect.   Imaging Studies: No results found.  Assessment and Plan:   Timothy Brown is a 61 y.o. y/o male with a history of dysphagia for over 20 years  could not undergo endoscopy previously as we were out of network.  Did not follow-up subsequently.  Referred today to see me for constipation but has failed MiraLAX  Plan 1.  EGD and colonoscopy for constipation and dysphagia we will also rule out eosinophilic esophagitis 2.  1 gallon of GoLytely to clean out his colon today following which he should start taking Trulance which I shall give samples today.  If the medications works he will call us in 5 days time to get a prescription if it does not work we will try different agent.  I have discussed alternative options, risks & benefits,  which include, but are not limited to, bleeding, infection,  perforation,respiratory complication & drug reaction.  The patient agrees with this plan & written consent will be obtained.       Dr Jonathon Bellows  MD,MRCP [facility]) Follow up in 3 months video visit

## 2021-09-18 ENCOUNTER — Other Ambulatory Visit: Payer: Self-pay

## 2021-09-18 ENCOUNTER — Telehealth: Payer: Self-pay | Admitting: Gastroenterology

## 2021-09-18 DIAGNOSIS — K5909 Other constipation: Secondary | ICD-10-CM

## 2021-09-18 DIAGNOSIS — R1319 Other dysphagia: Secondary | ICD-10-CM

## 2021-09-18 MED ORDER — GOLYTELY 236 G PO SOLR: 4000.0000 mL | Freq: Once | ORAL | 0 refills | Status: AC

## 2021-09-18 MED ORDER — NA SULFATE-K SULFATE-MG SULF 17.5-3.13-1.6 GM/177ML PO SOLN: 354.0000 mL | Freq: Once | ORAL | 0 refills | Status: AC

## 2021-09-18 NOTE — Addendum Note (Signed)
Addended by: Ulyess Blossom L on: 09/18/2021 04:11 PM   Modules accepted: Orders

## 2021-09-18 NOTE — Telephone Encounter (Signed)
Patients gf calling to talk to nurse about patients prescriptions prescribed by Dr Vicente Males. Please return call.

## 2021-09-18 NOTE — Telephone Encounter (Signed)
Patient states no one sent the prep for clean out to the pharmacy or sent a prep for the colonoscopy. Look at Dr. Vicente Males note and sent the clean out prep and the prep for colonoscopy. Realized no colonoscopy or EGD was schedule so I schedule the patient. Sent instructions to [employer], mailed and informed him of the instructions

## 2021-09-19 ENCOUNTER — Telehealth: Payer: Self-pay | Admitting: Gastroenterology

## 2021-09-19 NOTE — Telephone Encounter (Signed)
Patient would like to change colonoscopy date to a Monday or a Friday. Requesting call back.

## 2021-09-20 ENCOUNTER — Telehealth: Payer: Self-pay

## 2021-09-20 NOTE — Telephone Encounter (Signed)
Patients call has been returned to reschedule his procedures. Colonoscopy w/EGD have been rescheduled to 10/30/21.  Trish in Endo has been notified.  New instructions sent  via my chart and mailed to reflect new date.  Thanks, [place], [place]

## 2021-10-03 ENCOUNTER — Telehealth: Payer: Self-pay

## 2021-10-03 NOTE — Telephone Encounter (Signed)
Return call made to patient a Timothy Brown is not on his DPR.  Patient stated that he is experiencing stomach cramping thinks its related to the stress of work.  Has asked to move up his colonoscopy w/EGD.  Herb Grays, CMA (Dr.Anna's nurse) and I both checked the schedule.  Unfortunately Dr. Vicente Males does not have any time open earlier in August to move up the colonoscopy from 08/28.  Informed him if we had any cancellations we would let him know.  Thanks, [place], [place]

## 2021-10-27 ENCOUNTER — Encounter: Payer: Self-pay | Admitting: Gastroenterology

## 2021-10-30 ENCOUNTER — Other Ambulatory Visit: Payer: Self-pay

## 2021-10-30 ENCOUNTER — Ambulatory Visit
Admission: RE | Admit: 2021-10-30 | Discharge: 2021-10-30 | Disposition: A | Discharge status: Home / Self Care | Payer: 59 | Attending: Gastroenterology | Admitting: Gastroenterology

## 2021-10-30 ENCOUNTER — Ambulatory Visit: Payer: 59 | Admitting: Anesthesiology

## 2021-10-30 ENCOUNTER — Encounter: Payer: Self-pay | Admitting: Gastroenterology

## 2021-10-30 ENCOUNTER — Encounter
Admission: RE | Disposition: A | Discharge status: Home / Self Care | Payer: Self-pay | Source: Home / Self Care | Attending: Gastroenterology

## 2021-10-30 DIAGNOSIS — R1310 Dysphagia, unspecified: Secondary | ICD-10-CM | POA: Insufficient documentation

## 2021-10-30 DIAGNOSIS — K200 Eosinophilic esophagitis: Secondary | ICD-10-CM | POA: Diagnosis not present

## 2021-10-30 DIAGNOSIS — I10 Essential (primary) hypertension: Secondary | ICD-10-CM | POA: Insufficient documentation

## 2021-10-30 DIAGNOSIS — K219 Gastro-esophageal reflux disease without esophagitis: Secondary | ICD-10-CM | POA: Insufficient documentation

## 2021-10-30 DIAGNOSIS — K5730 Diverticulosis of large intestine without perforation or abscess without bleeding: Secondary | ICD-10-CM | POA: Diagnosis not present

## 2021-10-30 DIAGNOSIS — K5909 Other constipation: Secondary | ICD-10-CM

## 2021-10-30 DIAGNOSIS — G4730 Sleep apnea, unspecified: Secondary | ICD-10-CM | POA: Diagnosis not present

## 2021-10-30 DIAGNOSIS — Z1211 Encounter for screening for malignant neoplasm of colon: Secondary | ICD-10-CM

## 2021-10-30 DIAGNOSIS — R1319 Other dysphagia: Secondary | ICD-10-CM | POA: Diagnosis not present

## 2021-10-30 HISTORY — PX: COLONOSCOPY WITH PROPOFOL: SHX5780

## 2021-10-30 HISTORY — PX: ESOPHAGOGASTRODUODENOSCOPY (EGD) WITH PROPOFOL: SHX5813

## 2021-10-30 SURGERY — COLONOSCOPY WITH PROPOFOL
Anesthesia: General

## 2021-10-30 MED ORDER — PROPOFOL 10 MG/ML IV BOLUS
INTRAVENOUS | Status: DC | PRN
  Administered 2021-10-30: 110 mg via INTRAVENOUS

## 2021-10-30 MED ORDER — PROPOFOL 500 MG/50ML IV EMUL
INTRAVENOUS | Status: DC | PRN
  Administered 2021-10-30: 175 ug/kg/min via INTRAVENOUS

## 2021-10-30 MED ORDER — GLYCOPYRROLATE 0.2 MG/ML IJ SOLN
INTRAMUSCULAR | Status: DC | PRN
  Administered 2021-10-30: .2 mg via INTRAVENOUS

## 2021-10-30 MED ORDER — SODIUM CHLORIDE 0.9 % IV SOLN: INTRAVENOUS | Status: DC

## 2021-10-30 MED ORDER — GLYCOPYRROLATE 0.2 MG/ML IJ SOLN
INTRAMUSCULAR | Status: AC
  Filled 2021-10-30: qty 1

## 2021-10-30 MED ORDER — LIDOCAINE HCL (CARDIAC) PF 100 MG/5ML IV SOSY
PREFILLED_SYRINGE | INTRAVENOUS | Status: DC | PRN
  Administered 2021-10-30: 100 mg via INTRAVENOUS

## 2021-10-30 MED ORDER — DEXMEDETOMIDINE HCL 200 MCG/2ML IV SOLN
INTRAVENOUS | Status: DC | PRN
  Administered 2021-10-30: 20 ug via INTRAVENOUS

## 2021-10-30 NOTE — Anesthesia Postprocedure Evaluation (Signed)
Anesthesia Post Note  Patient: TIANT PEIXOTO  Procedure(s) Performed: COLONOSCOPY WITH PROPOFOL ESOPHAGOGASTRODUODENOSCOPY (EGD) WITH PROPOFOL  Patient location during evaluation: Endoscopy Anesthesia Type: General Level of consciousness: awake and alert Pain management: pain level controlled Vital Signs Assessment: post-procedure vital signs reviewed and stable Respiratory status: spontaneous breathing, nonlabored ventilation, respiratory function stable and patient connected to nasal cannula oxygen Cardiovascular status: blood pressure returned to baseline and stable Postop Assessment: no apparent nausea or vomiting Anesthetic complications: no   No notable events documented.   Last Vitals:  Vitals:   10/30/21 0958 10/30/21 1002  BP:  108/72  Pulse:    Resp:    Temp:    SpO2: 96%     Last Pain:  Vitals:   10/30/21 1008  TempSrc:   PainSc: 0-No pain                 Precious Haws Ramya Vanbergen

## 2021-10-30 NOTE — Transfer of Care (Signed)
Immediate Anesthesia Transfer of Care Note  Patient: Timothy Brown  Procedure(s) Performed: COLONOSCOPY WITH PROPOFOL ESOPHAGOGASTRODUODENOSCOPY (EGD) WITH PROPOFOL  Patient Location: Endoscopy Unit  Anesthesia Type:General  Level of Consciousness: drowsy  Airway & Oxygen Therapy: Patient Spontanous Breathing  Post-op Assessment: Report given to RN and Post -op Vital signs reviewed and stable  Post vital signs: Reviewed and stable  Last Vitals:  Vitals Value Taken Time  BP    Temp 35.7 C 10/30/21 0948  Pulse 54 10/30/21 0949  Resp 11 10/30/21 0949  SpO2 96 % 10/30/21 0949  Vitals shown include unvalidated device data.  Last Pain:  Vitals:   10/30/21 0803  TempSrc: Temporal  PainSc: 0-No pain         Complications: No notable events documented.

## 2021-10-30 NOTE — H&P (Signed)
Jonathon Bellows, MD [address] [place], [place], [place], [address] [address] [place], [place], [place], [address] Phone: [phone]  Fax: [phone]  Primary Care Physician:  Cletis Athens, MD   Pre-Procedure History & Physical: HPI:  Timothy Brown is a 61 y.o. male is here for an endoscopy and colonoscopy    Past Medical History:  Diagnosis Date   Anxiety    GERD (gastroesophageal reflux disease)    TUMS   Gout    Hypertension    no meds now   Rosacea    Shortness of breath    WITH EXERTION    Past Surgical History:  Procedure Laterality Date   ANTERIOR CERVICAL DECOMP/DISCECTOMY FUSION N/A 05/19/2012   Procedure: ANTERIOR CERVICAL DECOMPRESSION/DISCECTOMY FUSION 2 LEVELS;  Surgeon: Ophelia Charter, MD;  Location: [facility];  Service: Neurosurgery;  Laterality: N/A;  C56 C67 anterior cervical decompression with fusion interbody prothesis plating and bonegraft   BREAST LUMPECTOMY Left    LUMP ON NIPPLE @ 61 YRS OLD   COLONOSCOPY WITH PROPOFOL N/A 06/21/2016   Procedure: COLONOSCOPY WITH PROPOFOL;  Surgeon: Jonathon Bellows, MD;  Location: [facility];  Service: Endoscopy;  Laterality: N/A;   SHOULDER ARTHROSCOPY WITH ROTATOR CUFF REPAIR AND SUBACROMIAL DECOMPRESSION Right 02/14/2021   Procedure: SHOULDER ARTHROSCOPY WITH ROTATOR CUFF REPAIR AND SUBACROMIAL DECOMPRESSION;  Surgeon: Tania Ade, MD;  Location: [facility];  Service: Orthopedics;  Laterality: Right;    Prior to Admission medications   Medication Sig Start Date End Date Taking? Authorizing Provider  diazepam (VALIUM) 5 MG tablet Take 1 tablet (5 mg total) by mouth every 6 (six) hours as needed. 08/21/21  Yes Masoud, Viann Shove, MD  ranitidine (ZANTAC) 150 MG capsule Take 150 mg by mouth 2 (two) times daily.   Yes [provider]  aspirin EC 81 MG tablet Take 81 mg by mouth daily. Patient not taking: Reported on 10/30/2021    [provider]  clobetasol (TEMOVATE)  0.05 % external solution Apply 1 Application topically 2 (two) times daily as needed. 08/21/21   Cletis Athens, MD  doxycycline (VIBRA-TABS) 100 MG tablet TAKE 1 TABLET BY MOUTH EVERY DAY Patient taking differently: No sig reported 05/31/20   Cletis Athens, MD  sildenafil (VIAGRA) 100 MG tablet TAKE 1 TABLET BY MOUTH AS DIRECTED AS NEEDED 01/06/21   Cletis Athens, MD  vitamin C (ASCORBIC ACID) 500 MG tablet Take 500 mg by mouth daily. Patient not taking: Reported on 10/30/2021    [provider]    Allergies as of 09/19/2021   (No Known Allergies)    Family History  Problem Relation Age of Onset   Lung cancer Mother    Lung cancer Father     Social History   Socioeconomic History   Marital status: Single    Spouse name: Not on file   Number of children: Not on file   Years of education: Not on file   Highest education level: Not on file  Occupational History   Not on file  Tobacco Use   Smoking status: Never   Smokeless tobacco: Never  Vaping Use   Vaping Use: Never used  Substance and Sexual Activity   Alcohol use: Yes    Alcohol/week: 4.0 - 6.0 standard drinks of alcohol    Types: 4 - 6 Cans of beer per week    Comment: social   Drug use: Yes    Types: Marijuana    Comment: 40  YRS AGO   Sexual activity: Yes  Other Topics Concern   Not on file  Social History Narrative   Not on file   Social Determinants of Health   Financial Resource Strain: Not on file  Food Insecurity: Not on file  Transportation Needs: Not on file  Physical Activity: Not on file  Stress: Not on file  Social Connections: Not on file  Intimate Partner Violence: Not on file    Review of Systems: See HPI, otherwise negative ROS  Physical Exam: BP (!) 132/92   Pulse (!) 58   Temp (!) 96.7 F (35.9 C) (Temporal)   Resp 18   Ht 6' (1.829 m)   Wt 113.4 kg   SpO2 98%   BMI 33.91 kg/m  General:   Alert,  pleasant and cooperative in NAD Head:  Normocephalic and  atraumatic. Neck:  Supple; no masses or thyromegaly. Lungs:  Clear throughout to auscultation, normal respiratory effort.    Heart:  +S1, +S2, Regular rate and rhythm, No edema. Abdomen:  Soft, nontender and nondistended. Normal bowel sounds, without guarding, and without rebound.   Neurologic:  Alert and  oriented x4;  grossly normal neurologically.  Impression/Plan: Timothy Brown is here for an endoscopy and colonoscopy  to be performed for  evaluation of dysphagia and colon cancer screening     Risks, benefits, limitations, and alternatives regarding endoscopy have been reviewed with the patient.  Questions have been answered.  All parties agreeable.   Jonathon Bellows, MD  10/30/2021, 9:17 AM

## 2021-10-30 NOTE — Anesthesia Preprocedure Evaluation (Signed)
Anesthesia Evaluation  Patient identified by MRN, date of birth, ID band Patient awake    Reviewed: Allergy & Precautions, NPO status , Patient's Chart, lab work & pertinent test results  History of Anesthesia Complications Negative for: history of anesthetic complications  Airway Mallampati: III  TM Distance: <3 FB Neck ROM: full    Dental  (+) Chipped   Pulmonary neg shortness of breath, sleep apnea ,    Pulmonary exam normal        Cardiovascular Exercise Tolerance: Good hypertension, (-) anginaNormal cardiovascular exam     Neuro/Psych PSYCHIATRIC DISORDERS  Neuromuscular disease    GI/Hepatic Neg liver ROS, GERD  Controlled,  Endo/Other  negative endocrine ROS  Renal/GU negative Renal ROS  negative genitourinary   Musculoskeletal   Abdominal   Peds  Hematology negative hematology ROS (+)   Anesthesia Other Findings Past Medical History: No date: Anxiety No date: GERD (gastroesophageal reflux disease)     Comment:  TUMS No date: Gout No date: Hypertension     Comment:  no meds now No date: Rosacea No date: Shortness of breath     Comment:  WITH EXERTION  Past Surgical History: 05/19/2012: ANTERIOR CERVICAL DECOMP/DISCECTOMY FUSION; N/A     Comment:  Procedure: ANTERIOR CERVICAL DECOMPRESSION/DISCECTOMY               FUSION 2 LEVELS;  Surgeon: Ophelia Charter, MD;                Location: [facility];  Service: Neurosurgery;                Laterality: N/A;  C56 C67 anterior cervical decompression              with fusion interbody prothesis plating and bonegraft No date: BREAST LUMPECTOMY; Left     Comment:  LUMP ON NIPPLE @ 61 YRS OLD 06/21/2016: COLONOSCOPY WITH PROPOFOL; N/A     Comment:  Procedure: COLONOSCOPY WITH PROPOFOL;  Surgeon: Jonathon Bellows, MD;  Location: [facility];  Service: Endoscopy;              Laterality: N/A; 02/14/2021: SHOULDER ARTHROSCOPY WITH ROTATOR CUFF  REPAIR AND  SUBACROMIAL DECOMPRESSION; Right     Comment:  Procedure: SHOULDER ARTHROSCOPY WITH ROTATOR CUFF REPAIR              AND SUBACROMIAL DECOMPRESSION;  Surgeon: Tania Ade, MD;  Location: [facility];                Service: Orthopedics;  Laterality: Right;  BMI    Body Mass Index: 33.91 kg/m      Reproductive/Obstetrics negative OB ROS                             Anesthesia Physical Anesthesia Plan  ASA: 3  Anesthesia Plan: General   Post-op Pain Management:    Induction: Intravenous  PONV Risk Score and Plan: Propofol infusion and TIVA  Airway Management Planned: Natural Airway and Nasal Cannula  Additional Equipment:   Intra-op Plan:   Post-operative Plan:   Informed Consent: I have reviewed the patients History and Physical, chart, labs and discussed the procedure including the risks, benefits and alternatives for the proposed anesthesia with the patient or authorized representative who  has indicated his/her understanding and acceptance.     Dental Advisory Given  Plan Discussed with: Anesthesiologist, CRNA and Surgeon  Anesthesia Plan Comments: (Patient consented for risks of anesthesia including but not limited to:  - adverse reactions to medications - risk of airway placement if required - damage to eyes, teeth, lips or other oral mucosa - nerve damage due to positioning  - sore throat or hoarseness - Damage to heart, brain, nerves, lungs, other parts of body or loss of life  Patient voiced understanding.)        Anesthesia Quick Evaluation

## 2021-10-30 NOTE — Op Note (Signed)
[facility] Gastroenterology Patient Name: Timothy Brown Procedure Date: 10/30/2021 9:22 AM MRN: [id number] Account #: [id number] Date of Birth: 03-16-1960 Admit Type: Outpatient Age: 61 Room: [facility] ENDO ROOM 1 Gender: Male Note Status: Finalized Instrument Name: Upper Endoscope [id number] Procedure:             Upper GI endoscopy Indications:           Dysphagia Providers:             Jonathon Bellows MD, MD Referring MD:          Cletis Athens, MD (Referring MD) Medicines:             Monitored Anesthesia Care Complications:         No immediate complications. Procedure:             Pre-Anesthesia Assessment:                        - Prior to the procedure, a History and Physical was                         performed, and patient medications, allergies and                         sensitivities were reviewed. The patient's tolerance                         of previous anesthesia was reviewed.                        - The risks and benefits of the procedure and the                         sedation options and risks were discussed with the                         patient. All questions were answered and informed                         consent was obtained.                        - ASA Grade Assessment: II - A patient with mild                         systemic disease.                        After obtaining informed consent, the endoscope was                         passed under direct vision. Throughout the procedure,                         the patient's blood pressure, pulse, and oxygen                         saturations were monitored continuously. The Endoscope                         was introduced through  the mouth, and advanced to the                         third part of duodenum. The upper GI endoscopy was                         accomplished with ease. The patient tolerated the                         procedure well. Findings:      The stomach was normal.       The examined duodenum was normal.      The cardia and gastric fundus were normal on retroflexion.      The examined esophagus was normal. Biopsies were taken with a cold       forceps for histology. Impression:            - Normal stomach.                        - Normal examined duodenum.                        - Normal esophagus. Biopsied. Recommendation:        - Await pathology results.                        - Perform a colonoscopy today. Procedure Code(s):     --- Professional ---                        [phone], Esophagogastroduodenoscopy, flexible,                         transoral; with biopsy, single or multiple Diagnosis Code(s):     --- Professional ---                        R13.10, Dysphagia, unspecified CPT copyright 2019 American Medical Association. All rights reserved. The codes documented in this report are preliminary and upon coder review may  be revised to meet current compliance requirements. Jonathon Bellows, MD Jonathon Bellows MD, MD 10/30/2021 9:32:38 AM This report has been signed electronically. Number of Addenda: 0 Note Initiated On: 10/30/2021 9:22 AM Estimated Blood Loss:  Estimated blood loss: none.      [facility]

## 2021-10-30 NOTE — Op Note (Signed)
[facility] Gastroenterology Patient Name: Timothy Brown Procedure Date: 10/30/2021 9:21 AM MRN: [id number] Account #: [id number] Date of Birth: March 28, 1960 Admit Type: Outpatient Age: 61 Room: [facility] ENDO ROOM 1 Gender: Male Note Status: Finalized Instrument Name: Jasper Riling [id number] Procedure:             Colonoscopy Indications:           Screening for colorectal malignant neoplasm Providers:             Jonathon Bellows MD, MD Referring MD:          Cletis Athens, MD (Referring MD) Medicines:             Monitored Anesthesia Care Complications:         No immediate complications. Procedure:             Pre-Anesthesia Assessment:                        - Prior to the procedure, a History and Physical was                         performed, and patient medications, allergies and                         sensitivities were reviewed. The patient's tolerance                         of previous anesthesia was reviewed.                        - The risks and benefits of the procedure and the                         sedation options and risks were discussed with the                         patient. All questions were answered and informed                         consent was obtained.                        - ASA Grade Assessment: II - A patient with mild                         systemic disease.                        After obtaining informed consent, the colonoscope was                         passed under direct vision. Throughout the procedure,                         the patient's blood pressure, pulse, and oxygen                         saturations were monitored continuously. The                         Colonoscope was introduced  through the anus and                         advanced to the the cecum, identified by the                         appendiceal orifice. The colonoscopy was performed                         with ease. The patient tolerated the procedure well.                          The quality of the bowel preparation was excellent. Findings:      The perianal and digital rectal examinations were normal.      Multiple small-mouthed diverticula were found in the sigmoid colon.      The exam was otherwise without abnormality on direct and retroflexion       views. Impression:            - Diverticulosis in the sigmoid colon.                        - The examination was otherwise normal on direct and                         retroflexion views.                        - No specimens collected. Recommendation:        - Discharge patient to home (with escort).                        - Resume previous diet.                        - Continue present medications.                        - Repeat colonoscopy in 10 years for screening                         purposes. Procedure Code(s):     --- Professional ---                        [phone], Colonoscopy, flexible; diagnostic, including                         collection of specimen(s) by brushing or washing, when                         performed (separate procedure) Diagnosis Code(s):     --- Professional ---                        Z12.11, Encounter for screening for malignant neoplasm                         of colon                        K57.30, Diverticulosis of large intestine without  perforation or abscess without bleeding CPT copyright 2019 American Medical Association. All rights reserved. The codes documented in this report are preliminary and upon coder review may  be revised to meet current compliance requirements. Jonathon Bellows, MD Jonathon Bellows MD, MD 10/30/2021 9:46:49 AM This report has been signed electronically. Number of Addenda: 0 Note Initiated On: 10/30/2021 9:21 AM Scope Withdrawal Time: 0 hours 8 minutes 43 seconds  Total Procedure Duration: 0 hours 11 minutes 13 seconds  Estimated Blood Loss:  Estimated blood loss: none.      [facility]

## 2021-10-31 ENCOUNTER — Encounter: Payer: Self-pay | Admitting: Gastroenterology

## 2021-10-31 LAB — SURGICAL PATHOLOGY

## 2021-11-01 ENCOUNTER — Telehealth: Payer: Self-pay

## 2021-11-01 MED ORDER — OMEPRAZOLE 40 MG PO CPDR: 40.0000 mg | DELAYED_RELEASE_CAPSULE | Freq: Every day | ORAL | 3 refills | Status: DC

## 2021-11-01 NOTE — Telephone Encounter (Signed)
Called patient to let him know what Dr. Vicente Males stated below. Patient stated that he was not currently taking Omeprazole 40 MG daily. Therefore, I told him that a prescription was going to be sent to his pharmacy. Patient was also reminded of his appointment with Dr. Vicente Males and that he would discuss next steps with him on what to do. Patient understood and had no further questions.

## 2021-11-01 NOTE — Telephone Encounter (Signed)
-----   Message from Jonathon Bellows, MD sent at 11/01/2021  8:40 AM EDT ----- He has EOE, will need to discuss at office visit next steps . Ensure on Omeprazole 40 mg once daily, get food allergy profile done before office visit

## 2021-11-01 NOTE — Progress Notes (Signed)
He has EOE, will need to discuss at office visit next steps . Ensure on Omeprazole 40 mg once daily, get food allergy profile done before office visit

## 2021-11-03 ENCOUNTER — Telehealth: Payer: Self-pay

## 2021-11-03 NOTE — Telephone Encounter (Signed)
Patient's sister-Michelle called stating that her brother is till not able to eat due to chocking while eating or drinking anything. She wants to know what he is able to do since he chokes all the time.  I had called the patient days prior and I had told him to start taking the Omeprazole 40 MG once a day. This was sent to his pharmacy.

## 2021-11-10 NOTE — Telephone Encounter (Signed)
Called patient and had to leave him a voicemail since he did not answer. I stated that I was calling to check how he was doing since he was to start taking Omeprazole Daily. I also gave him my number to call me back.

## 2021-12-04 ENCOUNTER — Other Ambulatory Visit: Payer: Self-pay | Admitting: Gastroenterology

## 2021-12-18 ENCOUNTER — Emergency Department: Payer: 59

## 2021-12-18 ENCOUNTER — Ambulatory Visit: Payer: 59 | Admitting: Gastroenterology

## 2021-12-18 ENCOUNTER — Other Ambulatory Visit: Payer: Self-pay

## 2021-12-18 ENCOUNTER — Encounter: Payer: Self-pay | Admitting: Gastroenterology

## 2021-12-18 ENCOUNTER — Encounter: Payer: Self-pay | Admitting: Emergency Medicine

## 2021-12-18 ENCOUNTER — Emergency Department
Admission: EM | Admit: 2021-12-18 | Discharge: 2021-12-18 | Disposition: A | Discharge status: Home / Self Care | Payer: 59 | Attending: Emergency Medicine | Admitting: Emergency Medicine

## 2021-12-18 VITALS — BP 157/96 | HR 71 | Ht 72.0 in | Wt 262.0 lb

## 2021-12-18 DIAGNOSIS — I10 Essential (primary) hypertension: Secondary | ICD-10-CM | POA: Diagnosis not present

## 2021-12-18 DIAGNOSIS — R110 Nausea: Secondary | ICD-10-CM | POA: Diagnosis not present

## 2021-12-18 DIAGNOSIS — Z743 Need for continuous supervision: Secondary | ICD-10-CM | POA: Diagnosis not present

## 2021-12-18 DIAGNOSIS — R42 Dizziness and giddiness: Secondary | ICD-10-CM | POA: Diagnosis not present

## 2021-12-18 DIAGNOSIS — K200 Eosinophilic esophagitis: Secondary | ICD-10-CM

## 2021-12-18 DIAGNOSIS — K5909 Other constipation: Secondary | ICD-10-CM

## 2021-12-18 LAB — DIFFERENTIAL
Abs Immature Granulocytes: 0.03 10*3/uL (ref 0.00–0.07)
Basophils Absolute: 0 10*3/uL (ref 0.0–0.1)
Basophils Relative: 0 %
Eosinophils Absolute: 0.1 10*3/uL (ref 0.0–0.5)
Eosinophils Relative: 2 %
Immature Granulocytes: 0 %
Lymphocytes Relative: 18 %
Lymphs Abs: 1.3 10*3/uL (ref 0.7–4.0)
Monocytes Absolute: 0.4 10*3/uL (ref 0.1–1.0)
Monocytes Relative: 5 %
Neutro Abs: 5.7 10*3/uL (ref 1.7–7.7)
Neutrophils Relative %: 75 %

## 2021-12-18 LAB — COMPREHENSIVE METABOLIC PANEL
ALT: 20 U/L (ref 0–44)
AST: 29 U/L (ref 15–41)
Albumin: 4.2 g/dL (ref 3.5–5.0)
Alkaline Phosphatase: 45 U/L (ref 38–126)
Anion gap: 6 (ref 5–15)
BUN: 15 mg/dL (ref 8–23)
CO2: 24 mmol/L (ref 22–32)
Calcium: 8.7 mg/dL — ABNORMAL LOW (ref 8.9–10.3)
Chloride: 108 mmol/L (ref 98–111)
Creatinine, Ser: 1.09 mg/dL (ref 0.61–1.24)
GFR, Estimated: >60 mL/min (ref >60)
Glucose, Bld: 117 mg/dL — ABNORMAL HIGH (ref 70–99)
Potassium: 4.7 mmol/L (ref 3.5–5.1)
Sodium: 138 mmol/L (ref 135–145)
Total Bilirubin: 1 mg/dL (ref 0.3–1.2)
Total Protein: 7.1 g/dL (ref 6.5–8.1)

## 2021-12-18 LAB — CBC
HCT: 43.2 % (ref 39.0–52.0)
Hemoglobin: 14.2 g/dL (ref 13.0–17.0)
MCH: 31.6 pg (ref 26.0–34.0)
MCHC: 32.9 g/dL (ref 30.0–36.0)
MCV: 96.2 fL (ref 80.0–100.0)
Platelets: 144 10*3/uL — ABNORMAL LOW (ref 150–400)
RBC: 4.49 MIL/uL (ref 4.22–5.81)
RDW: 14.7 % (ref 11.5–15.5)
WBC: 7.6 10*3/uL (ref 4.0–10.5)
nRBC: 0 % (ref 0.0–0.2)

## 2021-12-18 LAB — ETHANOL: Alcohol, Ethyl (B): <10 mg/dL (ref <10)

## 2021-12-18 MED ORDER — SODIUM CHLORIDE 0.9% FLUSH: 3.0000 mL | Freq: Once | INTRAVENOUS | Status: DC

## 2021-12-18 MED ORDER — MECLIZINE HCL 25 MG PO TABS
25.0000 mg | ORAL_TABLET | Freq: Once | ORAL | Status: AC
  Administered 2021-12-18: 25 mg via ORAL
  Filled 2021-12-18: qty 1

## 2021-12-18 MED ORDER — FLUTICASONE PROPIONATE HFA 220 MCG/ACT IN AERO: 2.0000 | INHALATION_SPRAY | Freq: Two times a day (BID) | RESPIRATORY_TRACT | 5 refills | Status: DC

## 2021-12-18 MED ORDER — MECLIZINE HCL 25 MG PO TABS: 25.0000 mg | ORAL_TABLET | Freq: Three times a day (TID) | ORAL | 0 refills | Status: DC | PRN

## 2021-12-18 NOTE — Discharge Instructions (Addendum)
Avoid rapid head movements or bending over.  You can take the meclizine as needed for vertigo.  Return to emergency department emergently if you develop double vision change in vision difficulty speaking with slurred speech numbness or weakness.

## 2021-12-18 NOTE — ED Provider Triage Note (Signed)
Emergency Medicine Provider Triage Evaluation Note  Timothy Brown, a 61 y.o. male  was evaluated in triage.  Pt complains of dizziness that has been intermittent but worse over the last 3 days.  Patient reports he fell due to his dizziness today.  He denies any head injury or LOC.  Review of Systems  Positive: dizziness Negative: Facial droop, LOC  Physical Exam  BP (!) 156/99 (BP Location: Left Arm)   Pulse (!) 55   Temp 97.8 F (36.6 C) (Oral)   Resp 16   SpO2 99%  Gen:   Awake, no distress  NAD Resp:  Normal effort CTA MSK:   Moves extremities without difficulty  Other:    Medical Decision Making  Medically screening exam initiated at 5:29 PM.  Appropriate orders placed.  [name] was informed that the remainder of the evaluation will be completed by another provider, this initial triage assessment does not replace that evaluation, and the importance of remaining in the ED until their evaluation is complete.  Patient To the ED via EMS for evaluation of dizziness with loss of balance today.  He denies any head injury, LOC, facial droop, or slurred speech.   Melvenia Needles, PA-C 12/18/21 1731

## 2021-12-18 NOTE — ED Triage Notes (Signed)
Pt [place] ED via [facility] for dizziness. Pt states that the dizziness has been going on for a while but it got worse over the last 3 days. Pt states that today he fell because he was so dizzy. Pt did not hit his head. Pt states that he caught himself on his forearms. Pt is currently in NAD. Pt does not have slurred speech or facial droop.

## 2021-12-18 NOTE — Progress Notes (Signed)
Jonathon Bellows MD, MRCP(U.K) [address]  [place]  [place], [place] [address]  Main: [phone]  Fax: [phone]   Primary Care Physician: Cletis Athens, MD  Primary Gastroenterologist:  Dr. Jonathon Bellows   Chief Complaint  Patient presents with   Follow-up    HPI: Timothy Brown is a 61 y.o. male  Summary of history :   Previously seen by Dr. Bonna Gains in 11/23/2019 for dysphagia which has been ongoing for 20 years for solids.  Colonoscopy in 2018 by myself showed 1 tubular adenoma recommended repeat in 5 years.  He was suggested to undergo an EGD but was not done back then due to insurance and being out of network.  Today he is here today to see me for constipation.   Interval history  09/14/2021-12/18/2021  At her last visit in July 2023 he was complaining of difficulty swallowing and severe constipation failed MiraLAX  10/30/2021: EGD+colonoscopy: EGDNormal appearance of the esophagus biopsies were taken that showed markedly increased eosinophils up to 100 per high-power field.  Colonoscopy was performed on the same day showed diverticulosis of colon otherwise was normal  He says that the Trulance samples caused him severe diarrhea stop taking it would like something less intense.  Since taking the omeprazole his heartburn is resolved and no issues with dysphagia.  Not known to have any food allergies.   Current Outpatient Medications  Medication Sig Dispense Refill   clobetasol (TEMOVATE) 0.05 % external solution Apply 1 Application topically 2 (two) times daily as needed. 50 mL 2   diazepam (VALIUM) 5 MG tablet Take 1 tablet (5 mg total) by mouth every 6 (six) hours as needed. 30 tablet 1   doxycycline (VIBRA-TABS) 100 MG tablet TAKE 1 TABLET BY MOUTH EVERY DAY (Patient taking differently: No sig reported) 30 tablet 3   omeprazole (PRILOSEC) 40 MG capsule TAKE 1 CAPSULE (40 MG TOTAL) BY MOUTH DAILY. 90 capsule 3   sildenafil (VIAGRA) 100 MG tablet TAKE 1 TABLET BY  MOUTH AS DIRECTED AS NEEDED 4 tablet 8   aspirin EC 81 MG tablet Take 81 mg by mouth daily. (Patient not taking: Reported on 12/18/2021)     No current facility-administered medications for this visit.    Allergies as of 12/18/2021   (No Known Allergies)    ROS:  General: Negative for anorexia, weight loss, fever, chills, fatigue, weakness. ENT: Negative for hoarseness, difficulty swallowing , nasal congestion. CV: Negative for chest pain, angina, palpitations, dyspnea on exertion, peripheral edema.  Respiratory: Negative for dyspnea at rest, dyspnea on exertion, cough, sputum, wheezing.  GI: See history of present illness. GU:  Negative for dysuria, hematuria, urinary incontinence, urinary frequency, nocturnal urination.  Endo: Negative for unusual weight change.    Physical Examination:   BP (!) 157/96   Pulse 71   Ht 6' (1.829 m)   Wt 262 lb (118.8 kg)   BMI 35.53 kg/m   General: Well-nourished, well-developed in no acute distress.  Eyes: No icterus. Conjunctivae pink. Mouth: Oropharyngeal mucosa moist and pink , no lesions erythema or exudate. Neuro: Alert and oriented x 3.  Grossly intact. Skin: Warm and dry, no jaundice.   Psych: Alert and cooperative, normal mood and affect.   Imaging Studies: No results found.  Assessment and Plan:   Timothy Brown is a 61 y.o. y/o male with a history of dysphagia for over 20 years , severe constipation.  EGD confirmed diagnosis of severe eosinophilic esophagitis.  Plan 1.  Stop Trulance commence on Linzess 145 mcg samples will be provided for 2 weeks if it works he will call me for a prescription 2.  Fluticasone inhaler for eosinophilic esophagitis for 8 weeks 3.  Food allergy profile and TSH if food allergy profile is positive will refer to allergy and immunology 4.  Continue omeprazole long-term  I have discussed and demonstrated the inhaler technique for the fluticasone which needs to be swallowed and not inhaled   Dr  Jonathon Bellows  MD,MRCP [facility]) Follow up in 4 to 5 months

## 2021-12-18 NOTE — ED Provider Notes (Signed)
[facility] Provider Note    Event Date/Time   First MD Initiated Contact with Patient 12/18/21 1811     (approximate)   History   Dizziness   HPI  Timothy Brown is a 61 y.o. male with past medical history of anxiety, hypertension, GERD who presents with dizziness.  Patient notes that he has had intermittent episodes of spinning typically brought on by head movement or bending forward for about a year now.  He was at work when he suddenly felt his head was spinning.  He fell to the ground but did not hit his head.  When he got up he was staggering had to hold onto a wall to walk.  Tried driving home and called his wife felt like he was going to vomit and was having difficulty talking he tells me because felt he was going to vomit not because he was truly unable to speak.  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  He feels almost asymptomatic when he is sitting still does still feel somewhat of a full foggy sensation of his head but denies any vertiginous symptoms when he is still.  When he moves his head or sits forward he will get the dizzy feeling again.  Denies diplopia dysarthria numbness tingling weakness.  Denies hearing loss or tinnitus.     Past Medical History:  Diagnosis Date   Anxiety    GERD (gastroesophageal reflux disease)    TUMS   Gout    Hypertension    no meds now   Rosacea    Shortness of breath    WITH EXERTION    Patient Active Problem List   Diagnosis Date Noted   Rosacea 08/21/2021   Chronic right shoulder pain 01/30/2021   Acute gout of left foot 09/22/2020   Cervical spondylosis with myelopathy and radiculopathy 04/12/2020   Neck pain 04/12/2020   Anxiety 09/08/2019   Esophageal dysphagia 09/08/2019   Class 2 obesity due to excess calories without serious comorbidity with body mass index (BMI) of 35.0 to 35.9 in adult 09/08/2019   HTN (hypertension) 06/15/2019   Nerve damage 11/30/2015   Numbness and tingling  11/30/2015   Carpal tunnel syndrome 06/16/2013     Physical Exam  Triage Vital Signs: ED Triage Vitals  Enc Vitals Group     BP 12/18/21 1708 (!) 156/99     Pulse Rate 12/18/21 1708 (!) 55     Resp 12/18/21 1708 16     Temp 12/18/21 1714 97.8 F (36.6 C)     Temp Source 12/18/21 1714 Oral     SpO2 12/18/21 1708 99 %     Weight --      Height --      Head Circumference --      Peak Flow --      Pain Score 12/18/21 1710 0     Pain Loc --      Pain Edu? --      Excl. in [place]? --     Most recent vital signs: Vitals:   12/18/21 1708 12/18/21 1714  BP: (!) 156/99   Pulse: (!) 55   Resp: 16   Temp:  97.8 F (36.6 C)  SpO2: 99%      General: Awake, no distress.  CV:  Good peripheral perfusion.  Resp:  Normal effort.  Abd:  No distention.  Neuro:             Awake, Alert, Oriented  x 3  Other:  Aox3, nml speech  PERRL, EOMI, face symmetric, nml tongue movement  5/5 strength in the BL upper and lower extremities  Sensation grossly intact in the BL upper and lower extremities  Finger-nose-finger intact BL No ataxia normal gait   ED Results / Procedures / Treatments  Labs (all labs ordered are listed, but only abnormal results are displayed) Labs Reviewed  CBC - Abnormal; Notable for the following components:      Result Value   Platelets 144 (*)    All other components within normal limits  COMPREHENSIVE METABOLIC PANEL - Abnormal; Notable for the following components:   Glucose, Bld 117 (*)    Calcium 8.7 (*)    All other components within normal limits  DIFFERENTIAL  ETHANOL  PROTIME-INR  APTT  CBG MONITORING, ED     EKG  EKG interpretation performed by myself: sinus brady, nml axis, nml intervals, no acute ischemic changes    RADIOLOGY I reviewed and interpreted the CT scan of the brain which does not show any acute intracranial process    PROCEDURES:  Critical Care performed: No  Procedures   MEDICATIONS ORDERED IN ED: Medications   sodium chloride flush (NS) 0.9 % injection 3 mL (3 mLs Intravenous Not Given 12/18/21 1843)  meclizine (ANTIVERT) tablet 25 mg (25 mg Oral Given 12/18/21 1847)     IMPRESSION / MDM / ASSESSMENT AND PLAN / ED COURSE  I reviewed the triage vital signs and the nursing notes.                              Patient's presentation is most consistent with acute complicated illness / injury requiring diagnostic workup.  Differential diagnosis includes, but is not limited to, peripheral vertigo including BPPV, vestibular neuritis, Mnire's disease, central vertigo including posterior stroke, posterior TIA, cerebellar tumor,     Patient is a 61 year old male who presents with dizziness.  Episode today started when he bent over while at work suddenly had spinning feeling that lasted several hours.  He felt unsteady with ambulation while he was in the car and fell like he was going to vomit he had some difficulty speaking but says this is because he was so uncomfortable and not truly aphasic.  He has had multiple similar episodes in the past typically brought on by head movement but never lasting this long.  On my evaluation when he is sitting still his symptoms are gone but he is symptomatic with head movement and when he gets up and walks around.  His neurologic exam is essentially normal he has no nystagmus no dysmetria no ataxia and ambulates with steady gait.  CT head was ordered from triage and this is reassuring stroke labs also ordered including CBC and CMP are also reassuring.  Given the multiple similar episodes in the past fact that he is asymptomatic without movement and it is brought on by movement my suspicion is that this is peripheral vertigo likely BPPV.  My suspicion for central process is low especially given his neurologic exam and the fact that symptoms do not persist.  Patient was given meclizine and did feel improved continues to ambulate with steady gait.  At this point do not feel that  he requires MRI or further work-up in the ED.  Will give prescription for meclizine to take as needed.  Have also referred him to ENT as he may need to have additional work-up  for Mnire's disease or other peripheral process.  We discussed return precautions for any new neurologic symptoms including diplopia dysarthria or focal weakness.  He is appropriate for discharge.   FINAL CLINICAL IMPRESSION(S) / ED DIAGNOSES   Final diagnoses:  Vertigo     Rx / DC Orders   ED Discharge Orders          Ordered    meclizine (ANTIVERT) 25 MG tablet  3 times daily PRN        12/18/21 1922             Note:  This document was prepared using Dragon voice recognition software and may include unintentional dictation errors.   Rada Hay, MD 12/18/21 [phone]

## 2021-12-18 NOTE — ED Notes (Signed)
Pt Dc to home. Dc instructions reviewed with all questions answered. Pt verbalizes understanding. IV removed, cath intact, pressure dressing applied. No bleeding noted at site. Pt ambulatory out of dept with steady gait

## 2021-12-20 LAB — FOOD ALLERGY PROFILE
Allergen Corn, IgE: <0.1 kU/L
Clam IgE: <0.1 kU/L
Codfish IgE: <0.1 kU/L
Egg White IgE: <0.1 kU/L
Milk IgE: 0.72 kU/L — AB
Peanut IgE: <0.1 kU/L
Scallop IgE: <0.1 kU/L
Sesame Seed IgE: <0.1 kU/L
Shrimp IgE: <0.1 kU/L
Soybean IgE: <0.1 kU/L
Walnut IgE: <0.1 kU/L
Wheat IgE: 0.16 kU/L — AB

## 2021-12-20 LAB — TSH: TSH: 2.84 u[IU]/mL (ref 0.450–4.500)

## 2021-12-20 NOTE — Progress Notes (Signed)
IGE elevated to milk and nuts- advise to avoid and refer to allergy specialists

## 2021-12-21 ENCOUNTER — Telehealth: Payer: Self-pay

## 2021-12-21 DIAGNOSIS — R768 Other specified abnormal immunological findings in serum: Secondary | ICD-10-CM

## 2021-12-21 NOTE — Telephone Encounter (Signed)
Called patient but had to leave him a voicemail letting him know that his IGE was elevated and that Dr. Vicente Males recommends for him to stay away from milk and nuts. I also let him know that he recommended for him to be referred to an allergy specialist. I will send in the referral and that they will contact the patient to schedule him an appointment. I stated that if he had further questions, to please give me a call.  [facility] Phone: [phone] Fax: [phone]

## 2021-12-21 NOTE — Telephone Encounter (Signed)
-----   Message from Jonathon Bellows, MD sent at 12/20/2021  1:49 PM EDT ----- IGE elevated to milk and nuts- advise to avoid and refer to allergy specialists

## 2021-12-27 DIAGNOSIS — R42 Dizziness and giddiness: Secondary | ICD-10-CM | POA: Diagnosis not present

## 2021-12-27 DIAGNOSIS — H6123 Impacted cerumen, bilateral: Secondary | ICD-10-CM | POA: Diagnosis not present

## 2022-01-23 NOTE — Progress Notes (Unsigned)
    Benito Mccreedy D.[facility] Phone: [phone]   Assessment and Plan:     There are no diagnoses linked to this encounter.  ***   Pertinent previous records reviewed include ***   Follow Up: ***     Subjective:   I, Glenna Brunkow, am serving as a Education administrator for Doctor Glennon Mac  Chief Complaint: back pain   HPI:   01/24/2022 Patient is a 61 year old male complaining of back pain. Patient states  Relevant Historical Information: ***  Additional pertinent review of systems negative.   Current Outpatient Medications:    aspirin EC 81 MG tablet, Take 81 mg by mouth daily. (Patient not taking: Reported on 12/18/2021), Disp: , Rfl:    clobetasol (TEMOVATE) 0.05 % external solution, Apply 1 Application topically 2 (two) times daily as needed., Disp: 50 mL, Rfl: 2   diazepam (VALIUM) 5 MG tablet, Take 1 tablet (5 mg total) by mouth every 6 (six) hours as needed., Disp: 30 tablet, Rfl: 1   doxycycline (VIBRA-TABS) 100 MG tablet, TAKE 1 TABLET BY MOUTH EVERY DAY (Patient taking differently: No sig reported), Disp: 30 tablet, Rfl: 3   fluticasone (FLOVENT HFA) 220 MCG/ACT inhaler, Inhale 2 puffs into the lungs 2 (two) times daily. Inhale and swallow twice daily, Disp: 1 each, Rfl: 5   meclizine (ANTIVERT) 25 MG tablet, Take 1 tablet (25 mg total) by mouth 3 (three) times daily as needed for dizziness., Disp: 30 tablet, Rfl: 0   omeprazole (PRILOSEC) 40 MG capsule, TAKE 1 CAPSULE (40 MG TOTAL) BY MOUTH DAILY., Disp: 90 capsule, Rfl: 3   sildenafil (VIAGRA) 100 MG tablet, TAKE 1 TABLET BY MOUTH AS DIRECTED AS NEEDED, Disp: 4 tablet, Rfl: 8   Objective:     There were no vitals filed for this visit.    There is no height or weight on file to calculate BMI.    Physical Exam:    ***   Electronically signed by:  Benito Mccreedy D.[facility] 7:28 AM 01/23/22

## 2022-01-24 ENCOUNTER — Ambulatory Visit (INDEPENDENT_AMBULATORY_CARE_PROVIDER_SITE_OTHER): Payer: 59 | Admitting: Sports Medicine

## 2022-01-24 ENCOUNTER — Ambulatory Visit (INDEPENDENT_AMBULATORY_CARE_PROVIDER_SITE_OTHER): Payer: 59

## 2022-01-24 VITALS — BP 120/90 | HR 55 | Ht 72.0 in | Wt 266.0 lb

## 2022-01-24 DIAGNOSIS — M5136 Other intervertebral disc degeneration, lumbar region: Secondary | ICD-10-CM

## 2022-01-24 DIAGNOSIS — G8929 Other chronic pain: Secondary | ICD-10-CM | POA: Diagnosis not present

## 2022-01-24 DIAGNOSIS — M545 Low back pain, unspecified: Secondary | ICD-10-CM | POA: Diagnosis not present

## 2022-01-24 DIAGNOSIS — Z8781 Personal history of (healed) traumatic fracture: Secondary | ICD-10-CM

## 2022-01-24 MED ORDER — MELOXICAM 15 MG PO TABS: 15.0000 mg | ORAL_TABLET | Freq: Every day | ORAL | 0 refills | Status: DC

## 2022-01-24 NOTE — Patient Instructions (Addendum)
Good to see you  - Start meloxicam 15 mg daily x2 weeks.  If still having pain after 2 weeks, complete 3rd-week of meloxicam. May use remaining meloxicam as needed once daily for pain control.  Do not to use additional NSAIDs while taking meloxicam.  May use Tylenol [phone] mg 2 to 3 times a day for breakthrough pain. Low back HEP  Recommend no lifting more than 20 pounds  6 week follow up

## 2022-02-09 ENCOUNTER — Encounter: Payer: Self-pay | Admitting: Family Medicine

## 2022-02-09 MED ORDER — PREDNISONE 20 MG PO TABS: 40.0000 mg | ORAL_TABLET | Freq: Every day | ORAL | 0 refills | Status: DC

## 2022-02-13 ENCOUNTER — Ambulatory Visit: Payer: 59 | Admitting: Family Medicine

## 2022-02-13 VITALS — BP 124/86 | HR 56 | Ht 72.0 in | Wt 261.0 lb

## 2022-02-13 DIAGNOSIS — M5416 Radiculopathy, lumbar region: Secondary | ICD-10-CM

## 2022-02-13 DIAGNOSIS — M545 Low back pain, unspecified: Secondary | ICD-10-CM | POA: Diagnosis not present

## 2022-02-13 MED ORDER — TIZANIDINE HCL 2 MG PO TABS: 2.0000 mg | ORAL_TABLET | Freq: Every day | ORAL | 0 refills | Status: DC

## 2022-02-13 MED ORDER — METHYLPREDNISOLONE ACETATE 80 MG/ML IJ SUSP
80.0000 mg | Freq: Once | INTRAMUSCULAR | Status: AC
  Administered 2022-02-13: 80 mg via INTRAMUSCULAR

## 2022-02-13 MED ORDER — KETOROLAC TROMETHAMINE 60 MG/2ML IM SOLN
60.0000 mg | Freq: Once | INTRAMUSCULAR | Status: AC
  Administered 2022-02-13: 60 mg via INTRAMUSCULAR

## 2022-02-13 NOTE — Assessment & Plan Note (Addendum)
Patient is having lumbar radiculopathy at this time.  Patient did have arthritic changes noted at multiple levels on the x-ray and now having radicular symptoms with significant weakness (3/5 dorsiflexion on left).  Failed conservative therapy including HEP, PT, medications including muscle relaxer and anti-inflammatories.  Toradol and Depo-Medrol given today but I do feel that advanced imaging is warranted with patient having even weakness noted as well.  Discussed icing regimen and home exercises, discussed which activities to do and which ones to avoid.  Follow-up with me after the MRI to see if patient is a candidate for certain type of injections.  Zanaflex prescribed today to help with the nighttime pain.

## 2022-02-13 NOTE — Patient Instructions (Signed)
MRI [place] Injections in backside Zanaflex '2mg'$  at night We will be in touch with results

## 2022-02-13 NOTE — Progress Notes (Addendum)
[name] Phone: [phone] Subjective:   Timothy Brown, am serving as a scribe for Dr. Hulan Brown.  I'm seeing this patient by the request  of:  Timothy Athens, MD  CC: Severe back pain  [id number]  Timothy Brown is a 61 y.o. male coming in with complaint of low back pain.  Patient saw another provider previously and was given meloxicam.  Was not getting significant improvement.  Also had x-rays that were independently visualized by me showing the patient did have an anterior wedging of the L1 vertebrae and degenerative disc and facet arthropathy at multiple levels of the low back. Patient states that his lower back pain increased recently. Also had pain in hips and groin. Prednisone has been helpful while     Past Medical History:  Diagnosis Date   Anxiety    GERD (gastroesophageal reflux disease)    TUMS   Gout    Hypertension    Brown meds now   Rosacea    Shortness of breath    WITH EXERTION   Past Surgical History:  Procedure Laterality Date   ANTERIOR CERVICAL DECOMP/DISCECTOMY FUSION N/A 05/19/2012   Procedure: ANTERIOR CERVICAL DECOMPRESSION/DISCECTOMY FUSION 2 LEVELS;  Surgeon: Timothy Charter, MD;  Location: [facility];  Service: Neurosurgery;  Laterality: N/A;  C56 C67 anterior cervical decompression with fusion interbody prothesis plating and bonegraft   BREAST LUMPECTOMY Left    LUMP ON NIPPLE @ 61 YRS OLD   COLONOSCOPY WITH PROPOFOL N/A 06/21/2016   Procedure: COLONOSCOPY WITH PROPOFOL;  Surgeon: Timothy Bellows, MD;  Location: [facility];  Service: Endoscopy;  Laterality: N/A;   COLONOSCOPY WITH PROPOFOL N/A 10/30/2021   Procedure: COLONOSCOPY WITH PROPOFOL;  Surgeon: Timothy Bellows, MD;  Location: [facility];  Service: Gastroenterology;  Laterality: N/A;   ESOPHAGOGASTRODUODENOSCOPY (EGD) WITH PROPOFOL N/A 10/30/2021   Procedure: ESOPHAGOGASTRODUODENOSCOPY (EGD) WITH PROPOFOL;   Surgeon: Timothy Bellows, MD;  Location: [facility] ENDOSCOPY;  Service: Gastroenterology;  Laterality: N/A;   SHOULDER ARTHROSCOPY WITH ROTATOR CUFF REPAIR AND SUBACROMIAL DECOMPRESSION Right 02/14/2021   Procedure: SHOULDER ARTHROSCOPY WITH ROTATOR CUFF REPAIR AND SUBACROMIAL DECOMPRESSION;  Surgeon: Timothy Ade, MD;  Location: [facility];  Service: Orthopedics;  Laterality: Right;   Social History   Socioeconomic History   Marital status: Single    Spouse name: Not on file   Number of children: Not on file   Years of education: Not on file   Highest education level: Not on file  Occupational History   Not on file  Tobacco Use   Smoking status: Never   Smokeless tobacco: Never  Vaping Use   Vaping Use: Never used  Substance and Sexual Activity   Alcohol use: Yes    Alcohol/week: 4.0 - 6.0 standard drinks of alcohol    Types: 4 - 6 Cans of beer per week    Comment: social   Drug use: Yes    Types: Marijuana    Comment: 40 YRS AGO   Sexual activity: Yes  Other Topics Concern   Not on file  Social History Narrative   Not on file   Social Determinants of Health   Financial Resource Strain: Not on file  Food Insecurity: Not on file  Transportation Needs: Not on file  Physical Activity: Not on file  Stress: Not on file  Social Connections: Not on file   Brown Known Allergies Family History  Problem Relation Age of Onset  Lung cancer Mother    Lung cancer Father      Current Outpatient Medications (Cardiovascular):    sildenafil (VIAGRA) 100 MG tablet, TAKE 1 TABLET BY MOUTH AS DIRECTED AS NEEDED   Current Outpatient Medications (Analgesics):    meloxicam (MOBIC) 15 MG tablet, Take 1 tablet (15 mg total) by mouth daily.   Current Outpatient Medications (Other):    clobetasol (TEMOVATE) 0.05 % external solution, Apply 1 Application topically 2 (two) times daily as needed.   diazepam (VALIUM) 5 MG tablet, Take 1 tablet (5 mg total) by mouth every 6 (six)  hours as needed.   doxycycline (VIBRA-TABS) 100 MG tablet, TAKE 1 TABLET BY MOUTH EVERY DAY (Patient taking differently: Brown sig reported)   omeprazole (PRILOSEC) 40 MG capsule, TAKE 1 CAPSULE (40 MG TOTAL) BY MOUTH DAILY.   tiZANidine (ZANAFLEX) 2 MG tablet, Take 1 tablet (2 mg total) by mouth at bedtime.   Reviewed prior external information including notes and imaging from  primary care provider As well as notes that were available from care everywhere and other healthcare systems.  Past medical history, social, surgical and family history all reviewed in electronic medical record.  Brown pertanent information unless stated regarding to the chief complaint.   Review of Systems:  Brown headache, visual changes, nausea, vomiting, diarrhea, constipation, dizziness, abdominal pain, skin rash, fevers, chills, night sweats, weight loss, swollen lymph nodes, body aches, joint swelling, chest pain, shortness of breath, mood changes. POSITIVE muscle aches  Objective  Blood pressure 124/86, pulse (!) 56, height 6' (1.829 m), weight 261 lb (118.4 kg), SpO2 98 %.   General: Brown apparent distress alert and oriented x3 mood and affect normal, dressed appropriately.  HEENT: Pupils equal, extraocular movements intact  Respiratory: Patient's speak in full sentences and does not appear short of breath  Cardiovascular: Brown lower extremity edema, non tender, Brown erythema  Low back exam shows loss of lordosis.  Positive straight leg test that seems to be worse on the left than the right.  Some limited range of motion of the hips but secondary to more of a voluntary guarding.  Does seem to be neurovascularly intact with 3/5  weakness with dorsiflexion noted to the left greater than 4/5 on  right.    Impression and Recommendations:     The above documentation has been reviewed and is accurate and complete Timothy Pulley, DO

## 2022-02-16 DIAGNOSIS — E669 Obesity, unspecified: Secondary | ICD-10-CM | POA: Diagnosis not present

## 2022-02-16 DIAGNOSIS — Z809 Family history of malignant neoplasm, unspecified: Secondary | ICD-10-CM | POA: Diagnosis not present

## 2022-02-16 DIAGNOSIS — K219 Gastro-esophageal reflux disease without esophagitis: Secondary | ICD-10-CM | POA: Diagnosis not present

## 2022-02-16 DIAGNOSIS — Z6834 Body mass index (BMI) 34.0-34.9, adult: Secondary | ICD-10-CM | POA: Diagnosis not present

## 2022-02-16 DIAGNOSIS — Z791 Long term (current) use of non-steroidal anti-inflammatories (NSAID): Secondary | ICD-10-CM | POA: Diagnosis not present

## 2022-02-16 DIAGNOSIS — I10 Essential (primary) hypertension: Secondary | ICD-10-CM | POA: Diagnosis not present

## 2022-02-16 DIAGNOSIS — R42 Dizziness and giddiness: Secondary | ICD-10-CM | POA: Diagnosis not present

## 2022-02-16 DIAGNOSIS — L719 Rosacea, unspecified: Secondary | ICD-10-CM | POA: Diagnosis not present

## 2022-02-16 DIAGNOSIS — M1990 Unspecified osteoarthritis, unspecified site: Secondary | ICD-10-CM | POA: Diagnosis not present

## 2022-02-16 DIAGNOSIS — G4700 Insomnia, unspecified: Secondary | ICD-10-CM | POA: Diagnosis not present

## 2022-02-16 DIAGNOSIS — R69 Illness, unspecified: Secondary | ICD-10-CM | POA: Diagnosis not present

## 2022-02-16 DIAGNOSIS — G4730 Sleep apnea, unspecified: Secondary | ICD-10-CM | POA: Diagnosis not present

## 2022-02-20 ENCOUNTER — Encounter: Payer: Self-pay | Admitting: Internal Medicine

## 2022-02-20 ENCOUNTER — Ambulatory Visit (INDEPENDENT_AMBULATORY_CARE_PROVIDER_SITE_OTHER): Payer: 59

## 2022-02-20 ENCOUNTER — Ambulatory Visit: Payer: 59 | Admitting: Internal Medicine

## 2022-02-20 VITALS — BP 124/82 | HR 83 | Ht 72.0 in | Wt 258.7 lb

## 2022-02-20 DIAGNOSIS — Z6835 Body mass index (BMI) 35.0-35.9, adult: Secondary | ICD-10-CM | POA: Diagnosis not present

## 2022-02-20 DIAGNOSIS — M5416 Radiculopathy, lumbar region: Secondary | ICD-10-CM

## 2022-02-20 DIAGNOSIS — E6609 Other obesity due to excess calories: Secondary | ICD-10-CM

## 2022-02-20 DIAGNOSIS — M545 Low back pain, unspecified: Secondary | ICD-10-CM

## 2022-02-20 DIAGNOSIS — R1319 Other dysphagia: Secondary | ICD-10-CM

## 2022-02-20 DIAGNOSIS — L719 Rosacea, unspecified: Secondary | ICD-10-CM

## 2022-02-20 DIAGNOSIS — R69 Illness, unspecified: Secondary | ICD-10-CM | POA: Diagnosis not present

## 2022-02-20 DIAGNOSIS — M4726 Other spondylosis with radiculopathy, lumbar region: Secondary | ICD-10-CM | POA: Diagnosis not present

## 2022-02-20 DIAGNOSIS — I10 Essential (primary) hypertension: Secondary | ICD-10-CM

## 2022-02-20 DIAGNOSIS — M5116 Intervertebral disc disorders with radiculopathy, lumbar region: Secondary | ICD-10-CM | POA: Diagnosis not present

## 2022-02-20 DIAGNOSIS — F419 Anxiety disorder, unspecified: Secondary | ICD-10-CM

## 2022-02-20 MED ORDER — DIAZEPAM 5 MG PO TABS: 5.0000 mg | ORAL_TABLET | Freq: Four times a day (QID) | ORAL | 1 refills | Status: DC | PRN

## 2022-02-20 NOTE — Assessment & Plan Note (Signed)

## 2022-02-20 NOTE — Assessment & Plan Note (Signed)

## 2022-02-20 NOTE — Assessment & Plan Note (Signed)
-   Patient experiencing high levels of anxiety.  - Encouraged patient to engage in relaxing activities like yoga, meditation, journaling, going for a walk, or participating in a hobby.  - Encouraged patient to reach out to trusted friends or family members about recent struggles, Patient was advised to read A book, how to stop worrying and start living, it is good book to read to control  the stress  

## 2022-02-20 NOTE — Assessment & Plan Note (Signed)

## 2022-02-20 NOTE — Progress Notes (Signed)
Established Patient Office Visit  Subjective:  Patient ID: Timothy Brown, male    DOB: [birth year]  Age: 61 y.o. MRN: [id number]  CC:  Chief Complaint  Patient presents with   Medication Refill    Lorazepam refill    Medication Refill    [name] presents for check up for  Past Medical History:  Diagnosis Date   Anxiety    GERD (gastroesophageal reflux disease)    TUMS   Gout    Hypertension    no meds now   Rosacea    Shortness of breath    WITH EXERTION    Past Surgical History:  Procedure Laterality Date   ANTERIOR CERVICAL DECOMP/DISCECTOMY FUSION N/A 05/19/2012   Procedure: ANTERIOR CERVICAL DECOMPRESSION/DISCECTOMY FUSION 2 LEVELS;  Surgeon: Ophelia Charter, MD;  Location: [facility];  Service: Neurosurgery;  Laterality: N/A;  C56 C67 anterior cervical decompression with fusion interbody prothesis plating and bonegraft   BREAST LUMPECTOMY Left    LUMP ON NIPPLE @ 61 YRS OLD   COLONOSCOPY WITH PROPOFOL N/A 06/21/2016   Procedure: COLONOSCOPY WITH PROPOFOL;  Surgeon: Jonathon Bellows, MD;  Location: [facility] ENDOSCOPY;  Service: Endoscopy;  Laterality: N/A;   COLONOSCOPY WITH PROPOFOL N/A 10/30/2021   Procedure: COLONOSCOPY WITH PROPOFOL;  Surgeon: Jonathon Bellows, MD;  Location: [facility] ENDOSCOPY;  Service: Gastroenterology;  Laterality: N/A;   ESOPHAGOGASTRODUODENOSCOPY (EGD) WITH PROPOFOL N/A 10/30/2021   Procedure: ESOPHAGOGASTRODUODENOSCOPY (EGD) WITH PROPOFOL;  Surgeon: Jonathon Bellows, MD;  Location: [facility] ENDOSCOPY;  Service: Gastroenterology;  Laterality: N/A;   SHOULDER ARTHROSCOPY WITH ROTATOR CUFF REPAIR AND SUBACROMIAL DECOMPRESSION Right 02/14/2021   Procedure: SHOULDER ARTHROSCOPY WITH ROTATOR CUFF REPAIR AND SUBACROMIAL DECOMPRESSION;  Surgeon: Tania Ade, MD;  Location: [facility];  Service: Orthopedics;  Laterality: Right;    Family History  Problem Relation Age of Onset   Lung cancer Mother    Lung cancer Father     Social History    Socioeconomic History   Marital status: Single    Spouse name: Not on file   Number of children: Not on file   Years of education: Not on file   Highest education level: Not on file  Occupational History   Not on file  Tobacco Use   Smoking status: Never   Smokeless tobacco: Never  Vaping Use   Vaping Use: Never used  Substance and Sexual Activity   Alcohol use: Yes    Alcohol/week: 4.0 - 6.0 standard drinks of alcohol    Types: 4 - 6 Cans of beer per week    Comment: social   Drug use: Yes    Types: Marijuana    Comment: 40 YRS AGO   Sexual activity: Yes  Other Topics Concern   Not on file  Social History Narrative   Not on file   Social Determinants of Health   Financial Resource Strain: Not on file  Food Insecurity: Not on file  Transportation Needs: Not on file  Physical Activity: Not on file  Stress: Not on file  Social Connections: Not on file  Intimate Partner Violence: Not on file     Current Outpatient Medications:    clobetasol (TEMOVATE) 0.05 % external solution, Apply 1 Application topically 2 (two) times daily as needed., Disp: 50 mL, Rfl: 2   doxycycline (VIBRA-TABS) 100 MG tablet, TAKE 1 TABLET BY MOUTH EVERY DAY (Patient taking differently: No sig reported), Disp: 30 tablet, Rfl: 3   omeprazole (PRILOSEC) 40 MG capsule, TAKE 1  CAPSULE (40 MG TOTAL) BY MOUTH DAILY., Disp: 90 capsule, Rfl: 3   diazepam (VALIUM) 5 MG tablet, Take 1 tablet (5 mg total) by mouth every 6 (six) hours as needed., Disp: 30 tablet, Rfl: 1   sildenafil (VIAGRA) 100 MG tablet, TAKE 1 TABLET BY MOUTH AS DIRECTED AS NEEDED (Patient not taking: Reported on 02/20/2022), Disp: 4 tablet, Rfl: 8   tiZANidine (ZANAFLEX) 2 MG tablet, Take 1 tablet (2 mg total) by mouth at bedtime. (Patient not taking: Reported on 02/20/2022), Disp: 30 tablet, Rfl: 0   No Known Allergies  ROS Review of Systems  Constitutional: Negative.   HENT: Negative.    Eyes: Negative.   Respiratory: Negative.     Cardiovascular: Negative.   Gastrointestinal: Negative.   Endocrine: Negative.   Genitourinary: Negative.   Musculoskeletal: Negative.   Skin: Negative.   Allergic/Immunologic: Negative.   Neurological: Negative.   Hematological: Negative.   Psychiatric/Behavioral: Negative.    All other systems reviewed and are negative.     Objective:    Physical Exam Vitals reviewed.  Constitutional:      Appearance: Normal appearance.  HENT:     Mouth/Throat:     Mouth: Mucous membranes are moist.  Eyes:     Pupils: Pupils are equal, round, and reactive to light.  Neck:     Vascular: No carotid bruit.  Cardiovascular:     Rate and Rhythm: Normal rate and regular rhythm.     Pulses: Normal pulses.     Heart sounds: Normal heart sounds.  Pulmonary:     Effort: Pulmonary effort is normal.     Breath sounds: Normal breath sounds.  Abdominal:     General: Bowel sounds are normal.     Palpations: Abdomen is soft. There is no hepatomegaly, splenomegaly or mass.     Tenderness: There is no abdominal tenderness.     Hernia: No hernia is present.  Musculoskeletal:     Cervical back: Neck supple.     Right lower leg: No edema.     Left lower leg: No edema.  Skin:    Findings: No rash.  Neurological:     Mental Status: He is alert and oriented to person, place, and time.     Motor: No weakness.  Psychiatric:        Mood and Affect: Mood normal.        Behavior: Behavior normal.     BP 124/82   Pulse 83   Ht 6' (1.829 m)   Wt 258 lb 11.2 oz (117.3 kg)   SpO2 97%   BMI 35.09 kg/m  Wt Readings from Last 3 Encounters:  02/20/22 258 lb 11.2 oz (117.3 kg)  02/13/22 261 lb (118.4 kg)  01/24/22 266 lb (120.7 kg)     Health Maintenance Due  Topic Date Due   COVID-19 Vaccine (1) Never done   HIV Screening  Never done   Hepatitis C Screening  Never done   DTaP/Tdap/Td (1 - Tdap) Never done    There are no preventive care reminders to display for this patient.  Lab Results   Component Value Date   TSH 2.840 12/18/2021   Lab Results  Component Value Date   WBC 7.6 12/18/2021   HGB 14.2 12/18/2021   HCT 43.2 12/18/2021   MCV 96.2 12/18/2021   PLT 144 (L) 12/18/2021   Lab Results  Component Value Date   NA 138 12/18/2021   K 4.7 12/18/2021   CO2 24 12/18/2021  GLUCOSE 117 (H) 12/18/2021   BUN 15 12/18/2021   CREATININE 1.09 12/18/2021   BILITOT 1.0 12/18/2021   ALKPHOS 45 12/18/2021   AST 29 12/18/2021   ALT 20 12/18/2021   PROT 7.1 12/18/2021   ALBUMIN 4.2 12/18/2021   CALCIUM 8.7 (L) 12/18/2021   ANIONGAP 6 12/18/2021   No results found for: "CHOL" No results found for: "HDL" No results found for: "LDLCALC" No results found for: "TRIG" No results found for: "CHOLHDL" No results found for: "HGBA1C"    Assessment & Plan:   Problem List Items Addressed This Visit       Cardiovascular and Mediastinum   HTN (hypertension) - Primary     Patient denies any chest pain or shortness of breath there is no history of palpitation or paroxysmal nocturnal dyspnea   patient was advised to follow low-salt low-cholesterol diet    ideally I want to keep systolic blood pressure below 130 mmHg, patient was asked to check blood pressure one times a week and give me a report on that.  Patient will be follow-up in 3 months  or earlier as needed, patient will call me back for any change in the cardiovascular symptoms Patient was advised to buy a book from local bookstore concerning blood pressure and read several chapters  every day.  This will be supplemented by some of the material we will give him from the office.  Patient should also utilize other resources like YouTube and Internet to learn more about the blood pressure and the diet.        Digestive   Esophageal dysphagia    Counseling  If a person has gastroesophageal reflux disease (GERD), food and stomach acid move back up into the esophagus and cause symptoms or problems such as damage to the  esophagus.  Anti-reflux measures include: raising the head of the bed, avoiding tight clothing or belts, avoiding eating late at night, not lying down shortly after mealtime, and achieving weight loss.  Avoid ASA, NSAID's, caffeine, alcohol, and tobacco.   OTC Pepcid and/or Tums are often very helpful for as needed use.   However, for persisting chronic or daily symptoms, stronger medications like Omeprazole may be needed.  You may need to avoid foods and drinks such as: ? Coffee and tea (with or without caffeine). ? Drinks that contain alcohol. ? Energy drinks and sports drinks. ? Bubbly (carbonated) drinks or sodas. ? Chocolate and cocoa. ? Peppermint and mint flavorings. ? Garlic and onions. ? Horseradish. ? Spicy and acidic foods. These include peppers, chili powder, curry powder, vinegar, hot sauces, and BBQ sauce. ? Citrus fruit juices and citrus fruits, such as oranges, lemons, and limes. ? Tomato-based foods. These include red sauce, chili, salsa, and pizza with red sauce. ? Fried and fatty foods. These include donuts, french fries, potato chips, and high-fat dressings. ? High-fat meats. These include hot dogs, rib eye steak, sausage, ham, and bacon.         Nervous and Auditory   Lumbar radiculopathy    - Patient's back pain is under control with medication.  - Encouraged the patient to stretch or do yoga as able to help with back pain      Relevant Medications   diazepam (VALIUM) 5 MG tablet     Musculoskeletal and Integument   Rosacea     Other   Anxiety    - Patient experiencing high levels of anxiety.  - Encouraged patient to engage in relaxing activities like  yoga, meditation, journaling, going for a walk, or participating in a hobby.  - Encouraged patient to reach out to trusted friends or family members about recent struggles, Patient was advised to read A book, how to stop worrying and start living, it is good book to read to control  the stress        Relevant Medications   diazepam (VALIUM) 5 MG tablet   Class 2 obesity due to excess calories without serious comorbidity with body mass index (BMI) of 35.0 to 35.9 in adult    - I encouraged the patient to lose weight.  - I educated them on making healthy dietary choices including eating more fruits and vegetables and less fried foods. - I encouraged the patient to exercise more, and educated on the benefits of exercise including weight loss, diabetes prevention, and hypertension prevention.   Dietary counseling with a registered dietician  Referral to a weight management support group (e.g. Weight Watchers, Overeaters Anonymous)  If your BMI is greater than 29 or you have gained more than 15 pounds you should work on weight loss.  Attend a healthy cooking class       Meds ordered this encounter  Medications   diazepam (VALIUM) 5 MG tablet    Sig: Take 1 tablet (5 mg total) by mouth every 6 (six) hours as needed.    Dispense:  30 tablet    Refill:  1    This request is for a new prescription for a controlled substance as required by Federal/State law.    Follow-up: No follow-ups on file.    Cletis Athens, MD

## 2022-02-20 NOTE — Assessment & Plan Note (Signed)
-   Patient's back pain is under control with medication.  - Encouraged the patient to stretch or do yoga as able to help with back pain 

## 2022-02-21 ENCOUNTER — Other Ambulatory Visit: Payer: Self-pay | Admitting: Sports Medicine

## 2022-02-22 ENCOUNTER — Encounter: Payer: Self-pay | Admitting: Family Medicine

## 2022-02-22 ENCOUNTER — Other Ambulatory Visit: Payer: Self-pay

## 2022-02-22 DIAGNOSIS — M5416 Radiculopathy, lumbar region: Secondary | ICD-10-CM

## 2022-02-24 ENCOUNTER — Other Ambulatory Visit: Payer: 59

## 2022-03-02 ENCOUNTER — Other Ambulatory Visit: Payer: 59

## 2022-03-02 NOTE — Progress Notes (Deleted)
[place] Phone: [phone] Subjective:    I'm seeing this patient by the request  of:  Cletis Athens, MD  CC:   [id number]  02/13/2022 Patient is having lumbar radiculopathy at this time.  Patient did have arthritic changes noted at multiple levels on the x-ray and now having radicular symptoms with significant weakness.  Failed conservative therapy.  Toradol and Depo-Medrol given today but I do feel that advanced imaging is warranted with patient having even weakness noted as well.  Discussed icing regimen and home exercises, discussed which activities to do and which ones to avoid.  Follow-up with me after the MRI to see if patient is a candidate for certain type of injections.  Zanaflex prescribed today to help with the nighttime pain     Update 03/07/2022 COLSTON PYLE is a 61 y.o. male coming in with complaint of lumbar spine pain. Patient states   MRI 02/20/2022 lumbar IMPRESSION: Generalized lumbar spine degeneration causing moderate foraminal stenosis at L4-5 and L5-S1. Diffusely patent spinal canal.  Past Medical History:  Diagnosis Date   Anxiety    GERD (gastroesophageal reflux disease)    TUMS   Gout    Hypertension    no meds now   Rosacea    Shortness of breath    WITH EXERTION   Past Surgical History:  Procedure Laterality Date   ANTERIOR CERVICAL DECOMP/DISCECTOMY FUSION N/A 05/19/2012   Procedure: ANTERIOR CERVICAL DECOMPRESSION/DISCECTOMY FUSION 2 LEVELS;  Surgeon: Ophelia Charter, MD;  Location: [facility];  Service: Neurosurgery;  Laterality: N/A;  C56 C67 anterior cervical decompression with fusion interbody prothesis plating and bonegraft   BREAST LUMPECTOMY Left    LUMP ON NIPPLE @ 61 YRS OLD   COLONOSCOPY WITH PROPOFOL N/A 06/21/2016   Procedure: COLONOSCOPY WITH PROPOFOL;  Surgeon: Jonathon Bellows, MD;  Location: [facility] ENDOSCOPY;  Service: Endoscopy;  Laterality: N/A;    COLONOSCOPY WITH PROPOFOL N/A 10/30/2021   Procedure: COLONOSCOPY WITH PROPOFOL;  Surgeon: Jonathon Bellows, MD;  Location: [facility] ENDOSCOPY;  Service: Gastroenterology;  Laterality: N/A;   ESOPHAGOGASTRODUODENOSCOPY (EGD) WITH PROPOFOL N/A 10/30/2021   Procedure: ESOPHAGOGASTRODUODENOSCOPY (EGD) WITH PROPOFOL;  Surgeon: Jonathon Bellows, MD;  Location: [facility] ENDOSCOPY;  Service: Gastroenterology;  Laterality: N/A;   SHOULDER ARTHROSCOPY WITH ROTATOR CUFF REPAIR AND SUBACROMIAL DECOMPRESSION Right 02/14/2021   Procedure: SHOULDER ARTHROSCOPY WITH ROTATOR CUFF REPAIR AND SUBACROMIAL DECOMPRESSION;  Surgeon: Tania Ade, MD;  Location: [place];  Service: Orthopedics;  Laterality: Right;   Social History   Socioeconomic History   Marital status: Single    Spouse name: Not on file   Number of children: Not on file   Years of education: Not on file   Highest education level: Not on file  Occupational History   Not on file  Tobacco Use   Smoking status: Never   Smokeless tobacco: Never  Vaping Use   Vaping Use: Never used  Substance and Sexual Activity   Alcohol use: Yes    Alcohol/week: 4.0 - 6.0 standard drinks of alcohol    Types: 4 - 6 Cans of beer per week    Comment: social   Drug use: Yes    Types: Marijuana    Comment: 34 YRS AGO   Sexual activity: Yes  Other Topics Concern   Not on file  Social History Narrative   Not on file   Social Determinants of Health   Financial Resource Strain: Not  on file  Food Insecurity: Not on file  Transportation Needs: Not on file  Physical Activity: Not on file  Stress: Not on file  Social Connections: Not on file   No Known Allergies Family History  Problem Relation Age of Onset   Lung cancer Mother    Lung cancer Father      Current Outpatient Medications (Cardiovascular):    sildenafil (VIAGRA) 100 MG tablet, TAKE 1 TABLET BY MOUTH AS DIRECTED AS NEEDED (Patient not taking: Reported on 02/20/2022)     Current  Outpatient Medications (Other):    clobetasol (TEMOVATE) 0.05 % external solution, Apply 1 Application topically 2 (two) times daily as needed.   diazepam (VALIUM) 5 MG tablet, Take 1 tablet (5 mg total) by mouth every 6 (six) hours as needed.   doxycycline (VIBRA-TABS) 100 MG tablet, TAKE 1 TABLET BY MOUTH EVERY DAY (Patient taking differently: No sig reported)   omeprazole (PRILOSEC) 40 MG capsule, TAKE 1 CAPSULE (40 MG TOTAL) BY MOUTH DAILY.   tiZANidine (ZANAFLEX) 2 MG tablet, Take 1 tablet (2 mg total) by mouth at bedtime. (Patient not taking: Reported on 02/20/2022)   Reviewed prior external information including notes and imaging from  primary care provider As well as notes that were available from care everywhere and other healthcare systems.  Past medical history, social, surgical and family history all reviewed in electronic medical record.  No pertanent information unless stated regarding to the chief complaint.   Review of Systems:  No headache, visual changes, nausea, vomiting, diarrhea, constipation, dizziness, abdominal pain, skin rash, fevers, chills, night sweats, weight loss, swollen lymph nodes, body aches, joint swelling, chest pain, shortness of breath, mood changes. POSITIVE muscle aches  Objective  There were no vitals taken for this visit.   General: No apparent distress alert and oriented x3 mood and affect normal, dressed appropriately.  HEENT: Pupils equal, extraocular movements intact  Respiratory: Patient's speak in full sentences and does not appear short of breath  Cardiovascular: No lower extremity edema, non tender, no erythema      Impression and Recommendations:

## 2022-03-07 ENCOUNTER — Ambulatory Visit: Payer: 59 | Admitting: Family Medicine

## 2022-03-07 ENCOUNTER — Telehealth: Payer: Self-pay | Admitting: Family Medicine

## 2022-03-07 NOTE — Telephone Encounter (Signed)
Patient's fiance called to follow up on the Peer to Peer that was needed for his epidural. Has there been anything set up for him or what would next steps be?

## 2022-03-07 NOTE — Telephone Encounter (Signed)
The order has not been "DENIED" yet so I'm unable to setup a peer to peer. However, if the pt has new insurance then they will need to contact [place] to make them aware.

## 2022-03-07 NOTE — Telephone Encounter (Signed)
Left message with Tye Maryland at [facility].

## 2022-03-08 ENCOUNTER — Other Ambulatory Visit: Payer: Self-pay | Admitting: Family Medicine

## 2022-03-09 NOTE — Telephone Encounter (Signed)
[facility] called stating that they did get my message and the insurance girls are working on it.

## 2022-03-09 NOTE — Telephone Encounter (Signed)
Called again. Left message with Tye Maryland.

## 2022-03-14 ENCOUNTER — Emergency Department: Payer: 59

## 2022-03-14 ENCOUNTER — Other Ambulatory Visit: Payer: Self-pay

## 2022-03-14 ENCOUNTER — Emergency Department
Admission: EM | Admit: 2022-03-14 | Discharge: 2022-03-14 | Disposition: A | Discharge status: Home / Self Care | Payer: 59 | Attending: Emergency Medicine | Admitting: Emergency Medicine

## 2022-03-14 ENCOUNTER — Telehealth: Payer: Self-pay | Admitting: Family Medicine

## 2022-03-14 DIAGNOSIS — W19XXXA Unspecified fall, initial encounter: Secondary | ICD-10-CM

## 2022-03-14 DIAGNOSIS — Z743 Need for continuous supervision: Secondary | ICD-10-CM | POA: Diagnosis not present

## 2022-03-14 DIAGNOSIS — W1830XA Fall on same level, unspecified, initial encounter: Secondary | ICD-10-CM | POA: Diagnosis not present

## 2022-03-14 DIAGNOSIS — F10920 Alcohol use, unspecified with intoxication, uncomplicated: Secondary | ICD-10-CM | POA: Diagnosis not present

## 2022-03-14 DIAGNOSIS — R69 Illness, unspecified: Secondary | ICD-10-CM | POA: Diagnosis not present

## 2022-03-14 DIAGNOSIS — S060XAA Concussion with loss of consciousness status unknown, initial encounter: Secondary | ICD-10-CM | POA: Diagnosis not present

## 2022-03-14 DIAGNOSIS — S060X0A Concussion without loss of consciousness, initial encounter: Secondary | ICD-10-CM | POA: Diagnosis not present

## 2022-03-14 DIAGNOSIS — S0101XA Laceration without foreign body of scalp, initial encounter: Secondary | ICD-10-CM

## 2022-03-14 DIAGNOSIS — I959 Hypotension, unspecified: Secondary | ICD-10-CM | POA: Diagnosis not present

## 2022-03-14 DIAGNOSIS — I10 Essential (primary) hypertension: Secondary | ICD-10-CM | POA: Diagnosis not present

## 2022-03-14 DIAGNOSIS — R55 Syncope and collapse: Secondary | ICD-10-CM | POA: Diagnosis not present

## 2022-03-14 DIAGNOSIS — S0990XA Unspecified injury of head, initial encounter: Secondary | ICD-10-CM | POA: Diagnosis not present

## 2022-03-14 LAB — CBC
HCT: 43.5 % (ref 39.0–52.0)
Hemoglobin: 14.6 g/dL (ref 13.0–17.0)
MCH: 31.9 pg (ref 26.0–34.0)
MCHC: 33.6 g/dL (ref 30.0–36.0)
MCV: 95.2 fL (ref 80.0–100.0)
Platelets: 124 10*3/uL — ABNORMAL LOW (ref 150–400)
RBC: 4.57 MIL/uL (ref 4.22–5.81)
RDW: 14.1 % (ref 11.5–15.5)
WBC: 4.9 10*3/uL (ref 4.0–10.5)
nRBC: 0 % (ref 0.0–0.2)

## 2022-03-14 LAB — BASIC METABOLIC PANEL
Anion gap: 11 (ref 5–15)
BUN: 9 mg/dL (ref 8–23)
CO2: 23 mmol/L (ref 22–32)
Calcium: 7.8 mg/dL — ABNORMAL LOW (ref 8.9–10.3)
Chloride: 100 mmol/L (ref 98–111)
Creatinine, Ser: 0.91 mg/dL (ref 0.61–1.24)
GFR, Estimated: >60 mL/min (ref >60)
Glucose, Bld: 118 mg/dL — ABNORMAL HIGH (ref 70–99)
Potassium: 3.9 mmol/L (ref 3.5–5.1)
Sodium: 134 mmol/L — ABNORMAL LOW (ref 135–145)

## 2022-03-14 LAB — URINALYSIS, ROUTINE W REFLEX MICROSCOPIC
Bilirubin Urine: NEGATIVE
Glucose, UA: NEGATIVE mg/dL
Hgb urine dipstick: NEGATIVE
Ketones, ur: NEGATIVE mg/dL
Leukocytes,Ua: NEGATIVE
Nitrite: NEGATIVE
Protein, ur: NEGATIVE mg/dL
Specific Gravity, Urine: 1.005 (ref 1.005–1.030)
pH: 6 (ref 5.0–8.0)

## 2022-03-14 LAB — CBG MONITORING, ED: Glucose-Capillary: 114 mg/dL — ABNORMAL HIGH (ref 70–99)

## 2022-03-14 MED ORDER — LIDOCAINE-EPINEPHRINE 2 %-1:100000 IJ SOLN
20.0000 mL | Freq: Once | INTRAMUSCULAR | Status: AC
  Administered 2022-03-14: 20 mL via INTRADERMAL
  Filled 2022-03-14: qty 1

## 2022-03-14 MED ORDER — SODIUM CHLORIDE 0.9 % IV BOLUS
1000.0000 mL | Freq: Once | INTRAVENOUS | Status: AC
  Administered 2022-03-14: 1000 mL via INTRAVENOUS

## 2022-03-14 MED ORDER — MECLIZINE HCL 25 MG PO TABS
25.0000 mg | ORAL_TABLET | Freq: Once | ORAL | Status: AC
  Administered 2022-03-14: 25 mg via ORAL
  Filled 2022-03-14: qty 1

## 2022-03-14 NOTE — ED Triage Notes (Signed)
Pt arrives via ems from home, pt has fallen twice today, ems responded 2 separate times today, pt unable to states how he fell and is unable to remember what happened, pt did tell ems that he has some smirnoff today. Pt has an abrasion to the top of his head  pt reports having back pain and states that it always hurts, pt denies head or neck pain

## 2022-03-14 NOTE — Telephone Encounter (Signed)
Called patient and told him he is okay to have the epidural in my opinion but should talk to [place]

## 2022-03-14 NOTE — Telephone Encounter (Signed)
Insurance was denied. But they are going to pay at time of service.  Scheduled for 03/15/22.

## 2022-03-14 NOTE — Telephone Encounter (Signed)
Timothy Brown, patient's fiance, called stating that the patient fell and is in a lot of pain and having trouble walking.  He is scheduled for an epidural in the morning but didn't know if he should still have it or what is best?  Please advise.

## 2022-03-14 NOTE — ED Provider Notes (Signed)
[facility] Provider Note   Event Date/Time   First MD Initiated Contact with Patient 03/14/22 1902     (approximate) History  Fall and Head Injury  HPI Timothy Brown is a 62 y.o. male with a past medical history of everyday alcohol use who presents after a fall that occurred today with amnesia to the event.  Patient states that he has been drinking Smirnov ices this morning and has also been having vertigo symptoms over this morning as well.  Patient's wife at bedside and also provides a history that she was called after EMS was called to the house twice and patient refused the first time secondary to multiple falls. ROS: Patient currently denies any vision changes, tinnitus, difficulty speaking, facial droop, sore throat, chest pain, shortness of breath, abdominal pain, nausea/vomiting/diarrhea, dysuria, or weakness/numbness/paresthesias in any extremity   Physical Exam  Triage Vital Signs: ED Triage Vitals  Enc Vitals Group     BP 03/14/22 1736 138/89     Pulse Rate 03/14/22 1736 68     Resp 03/14/22 1736 16     Temp 03/14/22 1736 (!) 97.5 F (36.4 C)     Temp Source 03/14/22 1736 Oral     SpO2 03/14/22 1736 98 %     Weight 03/14/22 1737 250 lb (113.4 kg)     Height 03/14/22 1737 6' (1.829 m)     Head Circumference --      Peak Flow --      Pain Score 03/14/22 1736 9     Pain Loc --      Pain Edu? --      Excl. in [place]? --    Most recent vital signs: Vitals:   03/14/22 1736 03/14/22 2130  BP: 138/89 100/60  Pulse: 68 75  Resp: 16 16  Temp: (!) 97.5 F (36.4 C) 98 F (36.7 C)  SpO2: 98% 98%   General: Awake, oriented x 3, slurred speech CV:  Good peripheral perfusion.  Resp:  Normal effort.  Abd:  No distention.  Other:  Middle-aged overweight Caucasian male laying in bed in no acute distress.  1 cm laceration to the right upper forehead/frontal scalp ED Results / Procedures / Treatments  Labs (all labs ordered are listed, but only abnormal  results are displayed) Labs Reviewed  BASIC METABOLIC PANEL - Abnormal; Notable for the following components:      Result Value   Sodium 134 (*)    Glucose, Bld 118 (*)    Calcium 7.8 (*)    All other components within normal limits  CBC - Abnormal; Notable for the following components:   Platelets 124 (*)    All other components within normal limits  URINALYSIS, ROUTINE W REFLEX MICROSCOPIC - Abnormal; Notable for the following components:   Color, Urine STRAW (*)    APPearance CLEAR (*)    All other components within normal limits  CBG MONITORING, ED - Abnormal; Notable for the following components:   Glucose-Capillary 114 (*)    All other components within normal limits   EKG ED ECG REPORT I, Naaman Plummer, the attending physician, personally viewed and interpreted this ECG. Date: 03/14/2022 EKG Time: 1739 Rate: 73 Rhythm: normal sinus rhythm QRS Axis: normal Intervals: normal ST/T Wave abnormalities: normal Narrative Interpretation: no evidence of acute ischemia RADIOLOGY ED MD interpretation: CT of the head without contrast interpreted by me shows no evidence of acute abnormalities including no intracerebral hemorrhage, obvious masses, or significant edema -  Agree with radiology assessment Official radiology report(s): CT HEAD WO CONTRAST  Result Date: 03/14/2022 CLINICAL DATA:  Fall syncope EXAM: CT HEAD WITHOUT CONTRAST TECHNIQUE: Contiguous axial images were obtained from the base of the skull through the vertex without intravenous contrast. RADIATION DOSE REDUCTION: This exam was performed according to the departmental dose-optimization program which includes automated exposure control, adjustment of the mA and/or kV according to patient size and/or use of iterative reconstruction technique. COMPARISON:  CT brain 12/18/2021 FINDINGS: Brain: No acute territorial infarction, hemorrhage or intracranial mass. The ventricles are nonenlarged. Vascular: No hyperdense vessel or  unexpected calcification. Skull: Normal. Negative for fracture or focal lesion. Sinuses/Orbits: Moderate mucosal thickening in the sinuses. Other: None IMPRESSION: Negative non contrasted CT appearance of the brain. Electronically Signed   By: Donavan Foil M.D.   On: 03/14/2022 18:12   PROCEDURES: Critical Care performed: No ..Laceration Repair  Date/Time: 03/14/2022 8:16 PM  Performed by: Naaman Plummer, MD Authorized by: Naaman Plummer, MD   Consent:    Consent obtained:  Verbal   Consent given by:  Patient   Risks, benefits, and alternatives were discussed: yes     Risks discussed:  Infection, pain, retained foreign body, need for additional repair, poor cosmetic result, tendon damage, vascular damage, poor wound healing and nerve damage   Alternatives discussed:  No treatment, delayed treatment, observation and referral Universal protocol:    Immediately prior to procedure, a time out was called: yes     Patient identity confirmed:  Verbally with patient Anesthesia:    Anesthesia method:  Local infiltration Laceration details:    Length (cm):  1   Depth (mm):  5 Pre-procedure details:    Preparation:  Patient was prepped and draped in usual sterile fashion Exploration:    Wound exploration: entire depth of wound visualized     Contaminated: no   Treatment:    Area cleansed with:  Povidone-iodine and saline   Amount of cleaning:  Standard   Irrigation solution:  Sterile saline   Irrigation volume:  200   Irrigation method:  Syringe Skin repair:    Repair method:  Sutures   Suture size:  5-0   Suture material:  Chromic gut   Suture technique:  Simple interrupted   Number of sutures:  4 Approximation:    Approximation:  Close Repair type:    Repair type:  Simple Post-procedure details:    Dressing:  Antibiotic ointment and non-adherent dressing   Procedure completion:  Tolerated well, no immediate complications  MEDICATIONS ORDERED IN ED: Medications   lidocaine-EPINEPHrine (XYLOCAINE W/EPI) 2 %-1:100000 (with pres) injection 20 mL (20 mLs Intradermal Given by Other 03/14/22 2000)  sodium chloride 0.9 % bolus 1,000 mL (0 mLs Intravenous Stopped 03/14/22 2129)  meclizine (ANTIVERT) tablet 25 mg (25 mg Oral Given 03/14/22 2028)   IMPRESSION / MDM / [place] / ED COURSE  I reviewed the triage vital signs and the nursing notes.                             Patient's presentation is most consistent with acute presentation with potential threat to life or bodily function. Presenting after a fall that occurred just prior to arrival, resulting in injury to the scalp. The mechanism of injury was a mechanical ground level fall without syncope or near-syncope. The current level of pain is moderate. There was no loss of consciousness, confusion, seizure, or memory  impairment. There is a laceration associated with the injury. Denies neck pain. The patient does not take blood thinner medications. Denies vomiting, numbness/weakness, fever  Dispo: Discharge with PCP follow-up     FINAL CLINICAL IMPRESSION(S) / ED DIAGNOSES   Final diagnoses:  Fall, initial encounter  Concussion with unknown loss of consciousness status, initial encounter  Alcoholic intoxication without complication ([place])  Laceration of scalp, initial encounter   Rx / DC Orders   ED Discharge Orders     None      Note:  This document was prepared using Dragon voice recognition software and may include unintentional dictation errors.   Naaman Plummer, MD 03/14/22 [phone]

## 2022-03-14 NOTE — ED Notes (Signed)
First Nurse Note: Patient to ED via [facility] from home after multiple falls. Patient does not remember falling. Lac noted to left eyebrow and forehead. Denies blood thinners.  20 R wrist 70 HR 96% RA 142 cbg 111/61

## 2022-03-15 ENCOUNTER — Ambulatory Visit
Admission: RE | Admit: 2022-03-15 | Discharge: 2022-03-15 | Disposition: A | Discharge status: Home / Self Care | Payer: No Typology Code available for payment source | Source: Ambulatory Visit | Attending: Family Medicine | Admitting: Family Medicine

## 2022-03-15 DIAGNOSIS — M5416 Radiculopathy, lumbar region: Secondary | ICD-10-CM

## 2022-03-15 MED ORDER — METHYLPREDNISOLONE ACETATE 40 MG/ML INJ SUSP (RADIOLOG
80.0000 mg | Freq: Once | INTRAMUSCULAR | Status: AC
  Administered 2022-03-15: 80 mg via EPIDURAL

## 2022-03-15 MED ORDER — IOPAMIDOL (ISOVUE-M 200) INJECTION 41%
1.0000 mL | Freq: Once | INTRAMUSCULAR | Status: AC
  Administered 2022-03-15: 1 mL via EPIDURAL

## 2022-03-15 NOTE — Discharge Instructions (Signed)

## 2022-03-16 ENCOUNTER — Other Ambulatory Visit: Payer: Self-pay

## 2022-03-16 ENCOUNTER — Encounter: Payer: Self-pay | Admitting: Gastroenterology

## 2022-03-16 MED ORDER — LINACLOTIDE 145 MCG PO CAPS: 145.0000 ug | ORAL_CAPSULE | Freq: Every day | ORAL | 3 refills | Status: DC

## 2022-03-16 NOTE — Telephone Encounter (Signed)
Sharyn Lull called to let Dr Tamala Julian know that he had the epidural. He also was seen in the ED and had to have stiches in his head from the fall that happened on Wednesday.  She said that they were able to pay out of pocket to have the epidural but they told her if we are able to do a peer to peer and get it approved then they might be able to submit their receipt for some sort of reimbursement on what they paid? Do you know if a peer to peer can be done after the procedure has been completed?

## 2022-03-20 ENCOUNTER — Telehealth: Payer: Self-pay

## 2022-03-20 NOTE — Patient Outreach (Signed)
  Care Coordination TOC Note Transition Care Management Follow-up Telephone Call Date of discharge and from where: 03/15/22-[facility] ED  Dx: "fall" Red on EMMI-ED Discharge Alert Reason: "Scheduled follow-up appt? No" Red Alert Date: 03/16/22 How have you been since you were released from the hospital? Patient states that he is "doing better." He reports that MD gave him an epidural shot in his back which has improved his pain significantly. He had been out of work since fall but was able to return to work. He is only working 8 hr shift instead of his normal 12-14 hrs until he regains his strength and pain continues to improve.  Any questions or concerns? No  Items Reviewed: Did the pt receive and understand the discharge instructions provided? Yes  Medications obtained and verified?  Patient just getting off work-does not have meds with him Other? No  Any new allergies since your discharge? No  Dietary orders reviewed? Yes Do you have support at home? Yes -fiance  Home Care and Equipment/Supplies: Were home health services ordered? not applicable If so, what is the name of the agency? N/A  Has the agency set up a time to come to the patient's home? not applicable Were any new equipment or medical supplies ordered?  No What is the name of the medical supply agency? N/A Were you able to get the supplies/equipment? no Do you have any questions related to the use of the equipment or supplies? No  Functional Questionnaire: (I = Independent and D = Dependent) ADLs: I  Bathing/Dressing- I  Meal Prep- I  Eating- I  Maintaining continence- I  Transferring/Ambulation- I  Managing Meds- I  Follow up appointments reviewed:  PCP Hospital f/u appt confirmed?  Patient states that his fiance handles all his appts. He will check with her to see if she has scheduled appt and if not will have her do so.Declined needing assistance with making appt.   [facility] f/u appt confirmed?  N/A    Are transportation arrangements needed? No  If their condition worsens, is the pt aware to call PCP or go to the Emergency Dept.? Yes Was the patient provided with contact information for the PCP's office or ED? Yes Was to pt encouraged to call back with questions or concerns? Yes  SDOH assessments and interventions completed:   Yes SDOH Interventions Today    Flowsheet Row Most Recent Value  SDOH Interventions   Food Insecurity Interventions Intervention Not Indicated  Transportation Interventions Intervention Not Indicated       Care Coordination Interventions:  Education provided    Encounter Outcome:  Pt. Visit Completed    Enzo Montgomery, RN,BSN,CCM [place] Management Telephonic Care Management Coordinator Direct Phone: [phone] Toll Free: [phone] Fax: [phone]

## 2022-03-20 NOTE — Patient Outreach (Signed)
  Care Coordination TOC Note Transition Care Management Unsuccessful Follow-up Telephone Call  Date of discharge and from where:  03/15/22-[facility] ED  Attempts:  1st Attempt  Reason for unsuccessful TCM follow-up call:  Unable to leave message     Enzo Montgomery, RN,BSN,CCM [place] Management Telephonic Care Management Coordinator Direct Phone: [phone] Toll Free: [phone] Fax: [phone]

## 2022-03-23 NOTE — Telephone Encounter (Signed)
Sharyn Lull called stating that she spoke to [place].  They told her that the claim was denied due to not having enough information to meet the criteria. (The denial letter was scanned into media.) And we would need to submit more information for approval.  The patient did pay out of pocket for the procedure but they are trying to get it approved so they can file for reimbursement.  Can you help?

## 2022-03-23 NOTE — Telephone Encounter (Signed)
Can we find out if amitiza or trulance is covered, we can give samples till we figure out, can we get steve from the company to help ?

## 2022-03-26 ENCOUNTER — Other Ambulatory Visit: Payer: Self-pay

## 2022-03-26 MED ORDER — LUBIPROSTONE 24 MCG PO CAPS: 24.0000 ug | ORAL_CAPSULE | Freq: Two times a day (BID) | ORAL | 11 refills | Status: DC

## 2022-03-29 NOTE — Telephone Encounter (Signed)
Dr. Tamala Julian updated documentation & faxed to insurance.

## 2022-04-02 ENCOUNTER — Other Ambulatory Visit: Payer: Self-pay | Admitting: Internal Medicine

## 2022-04-09 ENCOUNTER — Telehealth: Payer: Self-pay | Admitting: Family Medicine

## 2022-04-09 NOTE — Telephone Encounter (Signed)
Patient's fiance called asking if another epidural could be ordered for him at [place]? He had the first one on 03/15/2022 and had some relief but is still having trouble bending, etc.  Please advise.

## 2022-04-10 ENCOUNTER — Other Ambulatory Visit: Payer: Self-pay

## 2022-04-10 DIAGNOSIS — M5416 Radiculopathy, lumbar region: Secondary | ICD-10-CM

## 2022-04-10 NOTE — Telephone Encounter (Signed)
Epidural ordered. Sent patient MyChart message.

## 2022-04-23 ENCOUNTER — Ambulatory Visit: Payer: 59 | Admitting: Gastroenterology

## 2022-04-25 NOTE — Telephone Encounter (Signed)
Letter received from [facility] stating that they have overturned their decision on the appeal. "We are responding to the appeal received on March 29, 2022, about the recent precertificiation request - the precertificiation denial for Current Procedural Terminology (CPT) code [phone]. After reviewing the information above, we are reversing our previous decision. We will now allow CPT code [phone] - Diagnostic or  Therapeutic Injection, a shot of pain numbing substance used to diagnose or treat pain delivered using imaging to guide the injection."

## 2022-04-25 NOTE — Telephone Encounter (Signed)
Patient is aware 

## 2022-06-01 IMAGING — DX DG CERVICAL SPINE 2 OR 3 VIEWS
4 series · 4 of 4 positions shown · non-contrast
Comparison: 04/12/2020

CLINICAL DATA: Chronic neck pain

EXAM:
CERVICAL SPINE - 2-3 VIEW

[c-spine lat]
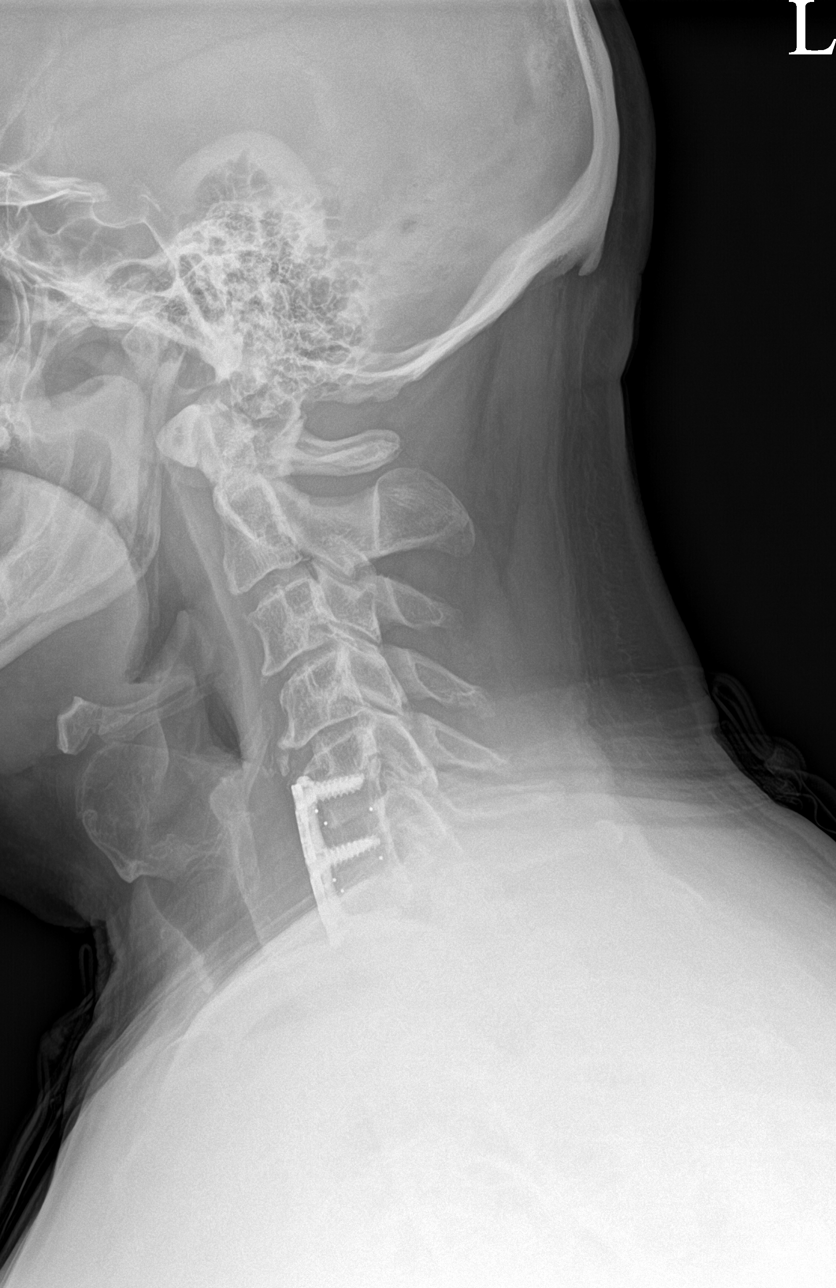

[c-spine ap]
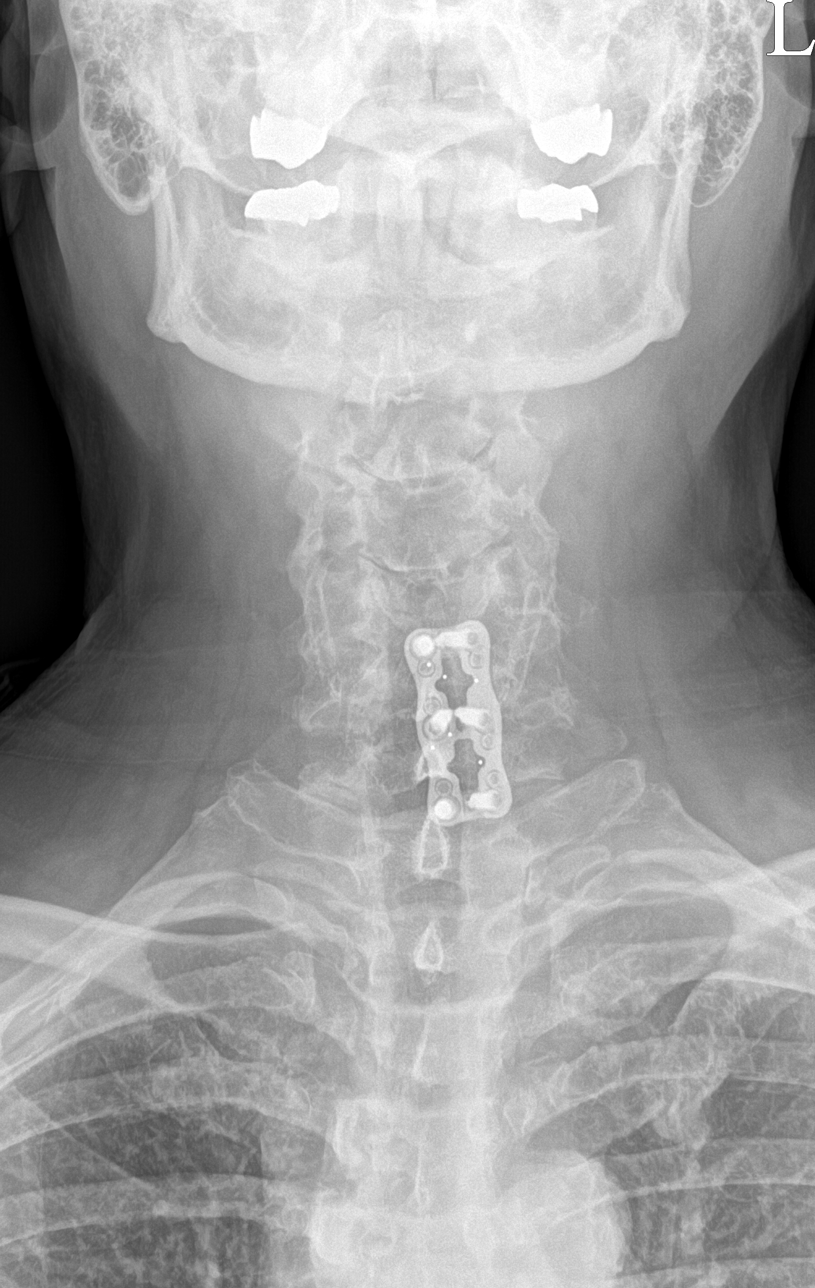

[c-spine open mouth]
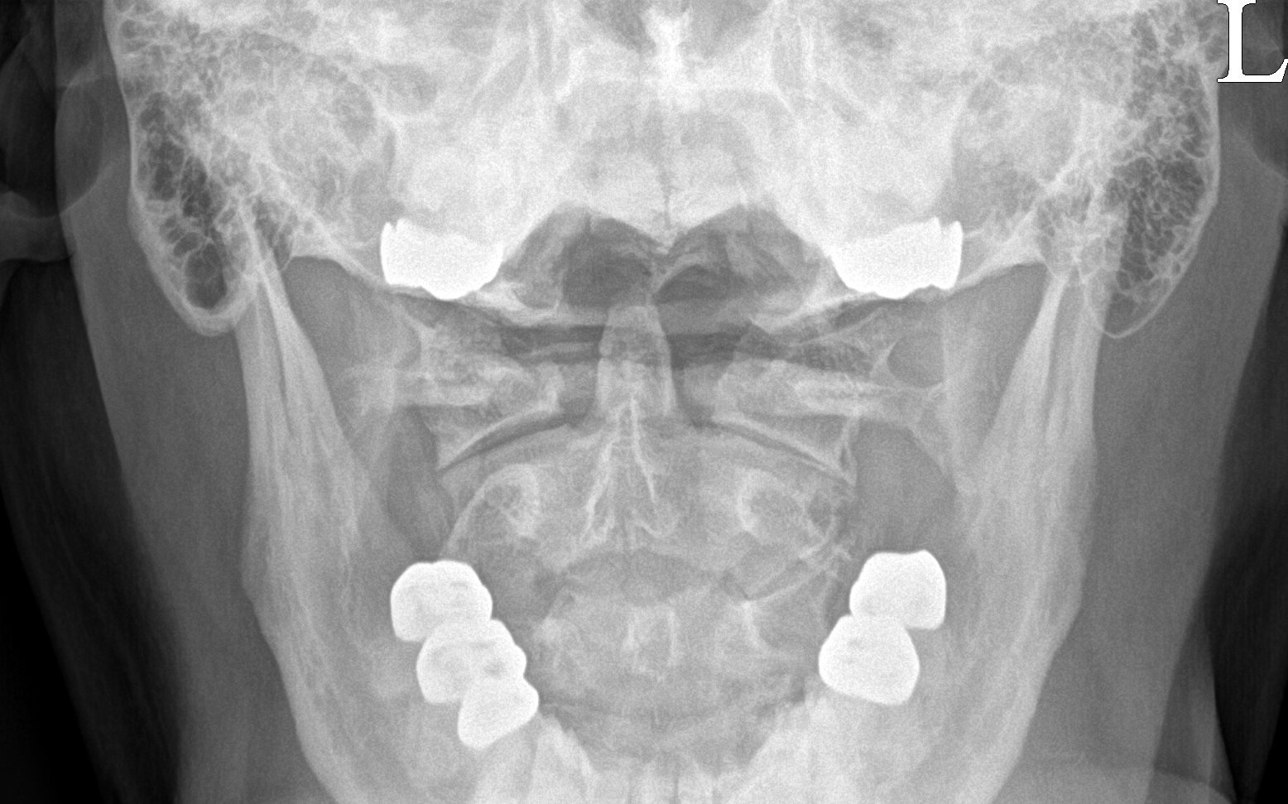

[ct-spine swimmers]
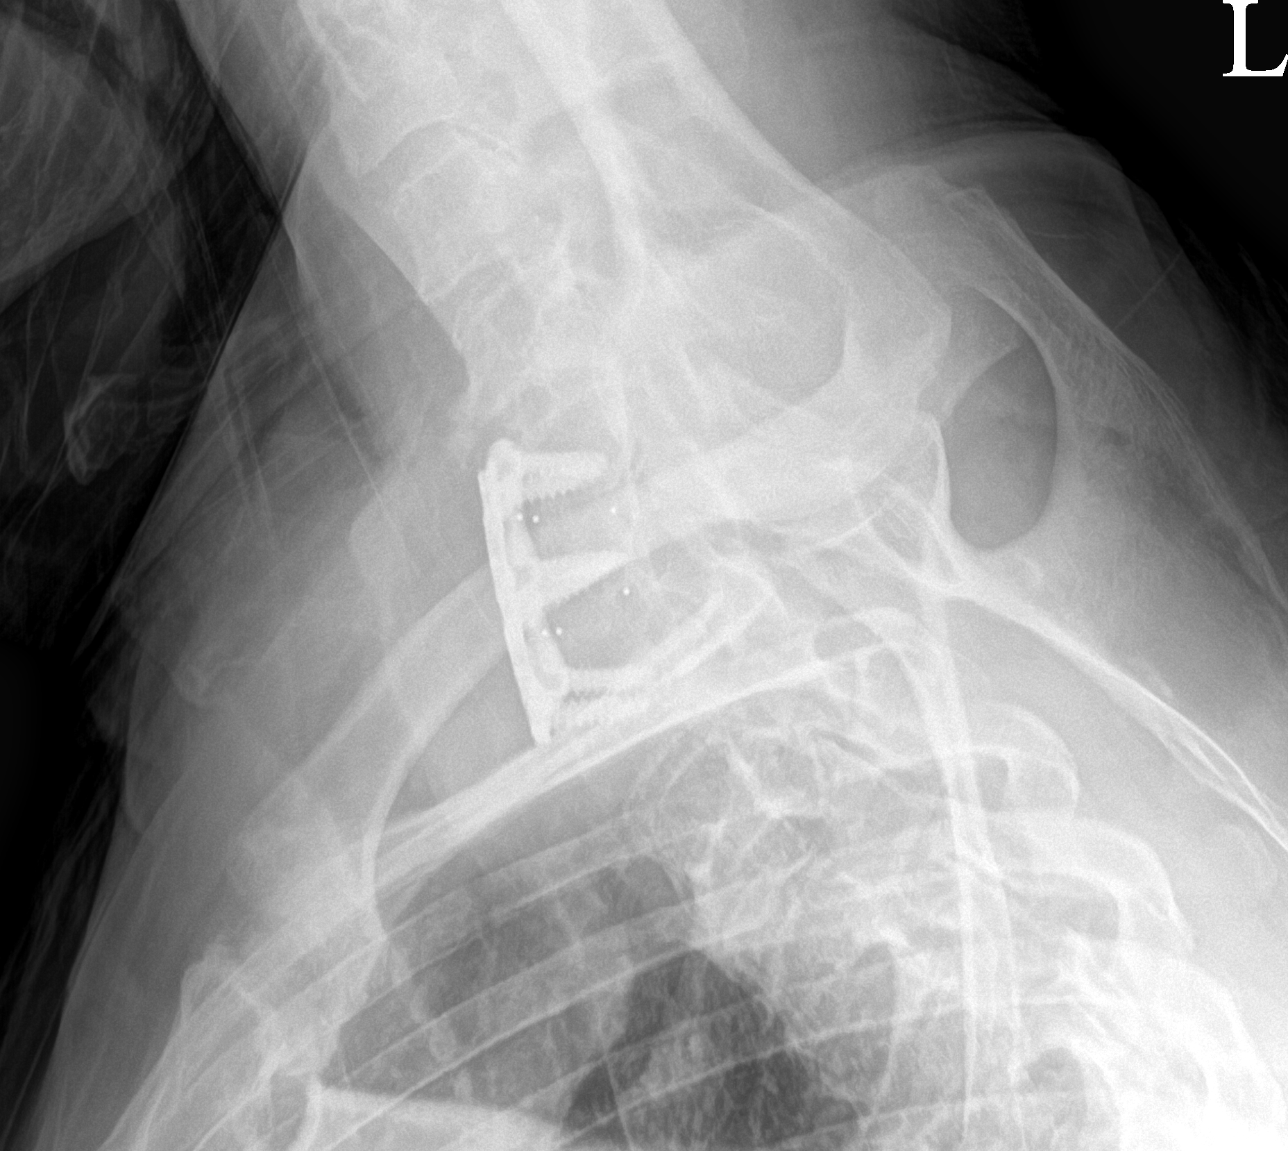

[4 of 4 positions shown; findings below may reference images not displayed]

FINDINGS: Mild reversal of cervical lordosis. Anterior fusion hardware C5
through C7 with solid bone fusion. Moderate degenerative changes at
C3-C4 and C4-C5. Dens and lateral masses are within normal limits.
Suboptimal visualization of cervicothoracic junction
IMPRESSION: Reversal of cervical lordosis with post fusion changes C5 through
C7. Moderate degenerative changes at C3-C4 and C4-C5

## 2022-06-25 ENCOUNTER — Ambulatory Visit: Payer: 59 | Admitting: Internal Medicine

## 2022-10-12 ENCOUNTER — Other Ambulatory Visit: Payer: Self-pay

## 2022-10-12 MED ORDER — LINACLOTIDE 145 MCG PO CAPS: 145.0000 ug | ORAL_CAPSULE | Freq: Every day | ORAL | 3 refills | Status: DC

## 2022-12-04 NOTE — Progress Notes (Unsigned)
[facility] [address] [place] [address] Phone: [phone] Subjective:   Bruce Donath, am serving as a scribe for Dr. Antoine Primas.  I'm seeing this patient by the request  of:  Corky Downs, MD  CC: Bilateral hip pain  BJY:[id number]  02/13/2022 Patient is having lumbar radiculopathy at this time.  Patient did have arthritic changes noted at multiple levels on the x-ray and now having radicular symptoms with significant weakness (3/5 dorsiflexion on left).  Failed conservative therapy including HEP, PT, medications including muscle relaxer and anti-inflammatories.  Toradol and Depo-Medrol given today but I do feel that advanced imaging is warranted with patient having even weakness noted as well.  Discussed icing regimen and home exercises, discussed which activities to do and which ones to avoid.  Follow-up with me after the MRI to see if patient is a candidate for certain type of injections.  Zanaflex prescribed today to help with the nighttime pain      Update 12/05/2022 ARSEN MANGIONE is a 62 y.o. male coming in with complaint of lumbar radiculopathy.  Having more presentation of bilateral hip pain.  Previous workup for lumbar radiculopathy showed the patient did have moderate stenosis at L4-L5 and L5-S1.  Mostly foraminal.  The patient had responded well to an epidural in January and did have another order for another 1 that patient has not done.  Patient states B hip pain over GT for past 6 months. Unable to sleep due to pain. 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 Denies any back pain with hip pain. Uses Aleve, IBU and Tylenol for pain relief.        Past Medical History:  Diagnosis Date   Anxiety    GERD (gastroesophageal reflux disease)    TUMS   Gout    Hypertension    no meds now   Rosacea    Shortness of breath    WITH EXERTION   Past Surgical History:  Procedure Laterality Date    ANTERIOR CERVICAL DECOMP/DISCECTOMY FUSION N/A 05/19/2012   Procedure: ANTERIOR CERVICAL DECOMPRESSION/DISCECTOMY FUSION 2 LEVELS;  Surgeon: Cristi Loron, MD;  Location: [facility];  Service: Neurosurgery;  Laterality: N/A;  C56 C67 anterior cervical decompression with fusion interbody prothesis plating and bonegraft   BREAST LUMPECTOMY Left    LUMP ON NIPPLE @ 62 YRS OLD   COLONOSCOPY WITH PROPOFOL N/A 06/21/2016   Procedure: COLONOSCOPY WITH PROPOFOL;  Surgeon: Wyline Mood, MD;  Location: [facility] ENDOSCOPY;  Service: Endoscopy;  Laterality: N/A;   COLONOSCOPY WITH PROPOFOL N/A 10/30/2021   Procedure: COLONOSCOPY WITH PROPOFOL;  Surgeon: Wyline Mood, MD;  Location: [facility] ENDOSCOPY;  Service: Gastroenterology;  Laterality: N/A;   ESOPHAGOGASTRODUODENOSCOPY (EGD) WITH PROPOFOL N/A 10/30/2021   Procedure: ESOPHAGOGASTRODUODENOSCOPY (EGD) WITH PROPOFOL;  Surgeon: Wyline Mood, MD;  Location: [facility] ENDOSCOPY;  Service: Gastroenterology;  Laterality: N/A;   SHOULDER ARTHROSCOPY WITH ROTATOR CUFF REPAIR AND SUBACROMIAL DECOMPRESSION Right 02/14/2021   Procedure: SHOULDER ARTHROSCOPY WITH ROTATOR CUFF REPAIR AND SUBACROMIAL DECOMPRESSION;  Surgeon: Jones Broom, MD;  Location: [facility];  Service: Orthopedics;  Laterality: Right;   Social History   Socioeconomic History   Marital status: Single    Spouse name: Not on file   Number of children: Not on file   Years of education: Not on file   Highest education level: Not on file  Occupational History   Not on file  Tobacco Use   Smoking status: Never   Smokeless tobacco: Never  Vaping Use   Vaping status: Never Used  Substance and Sexual Activity   Alcohol use: Yes    Alcohol/week: 4.0 - 6.0 standard drinks of alcohol    Types: 4 - 6 Cans of beer per week    Comment: social   Drug use: Yes    Types: Marijuana    Comment: 40 YRS AGO   Sexual activity: Yes  Other Topics Concern   Not on file  Social History Narrative    Not on file   Social Determinants of Health   Financial Resource Strain: Not on file  Food Insecurity: No Food Insecurity (03/20/2022)   Hunger Vital Sign    Worried About Running Out of Food in the Last Year: Never true    Ran Out of Food in the Last Year: Never true  Transportation Needs: No Transportation Needs (03/20/2022)   PRAPARE - Administrator, Civil Service (Medical): No    Lack of Transportation (Non-Medical): No  Physical Activity: Not on file  Stress: Not on file  Social Connections: Not on file   No Known Allergies Family History  Problem Relation Age of Onset   Lung cancer Mother    Lung cancer Father      Current Outpatient Medications (Cardiovascular):    sildenafil (VIAGRA) 100 MG tablet, TAKE 1 TABLET BY MOUTH AS DIRECTED AS NEEDED (Patient not taking: Reported on 02/20/2022)     Current Outpatient Medications (Other):    clobetasol (TEMOVATE) 0.05 % external solution, Apply 1 Application topically 2 (two) times daily as needed.   diazepam (VALIUM) 5 MG tablet, Take 1 tablet (5 mg total) by mouth every 6 (six) hours as needed.   doxycycline (VIBRA-TABS) 100 MG tablet, TAKE 1 TABLET BY MOUTH EVERY DAY (Patient taking differently: No sig reported)   linaclotide (LINZESS) 145 MCG CAPS capsule, Take 1 capsule (145 mcg total) by mouth daily.   omeprazole (PRILOSEC) 40 MG capsule, TAKE 1 CAPSULE (40 MG TOTAL) BY MOUTH DAILY.   tiZANidine (ZANAFLEX) 2 MG tablet, TAKE 1 TABLET BY MOUTH AT BEDTIME.   Reviewed prior external information including notes and imaging from  primary care provider As well as notes that were available from care everywhere and other healthcare systems.  Past medical history, social, surgical and family history all reviewed in electronic medical record.  No pertanent information unless stated regarding to the chief complaint.   Review of Systems:  No headache, visual changes, nausea, vomiting, diarrhea, constipation,  dizziness, abdominal pain, skin rash, fevers, chills, night sweats, weight loss, swollen lymph nodes, body aches, joint swelling, chest pain, shortness of breath, mood changes. POSITIVE muscle aches  Objective  Blood pressure 114/82, pulse (!) 57, height 6' (1.829 m), weight 248 lb (112.5 kg), SpO2 97%.   General: No apparent distress alert and oriented x3 mood and affect normal, dressed appropriately.  HEENT: Pupils equal, extraocular movements intact  Respiratory: Patient's speak in full sentences and does not appear short of breath  Cardiovascular: No lower extremity edema, non tender, no erythema  Low back exam shows does have loss of lordosis noted.  Severe tenderness over the greater trochanteric areas bilaterally.  Negative straight leg test.  Still though has poor core strength noted.   After verbal consent patient was prepped with alcohol swab and with a 21-gauge 2 inch needle injected into the right greater trochanteric area with 2 cc of 0.5% Marcaine and  1 cc of Kenalog 40 mg/mL.  No blood loss.  Band-Aid placed.  Postinjection instructions given   After verbal consent patient was prepped with alcohol swab and with a 21-gauge 2 inch needle injected into the left greater trochanteric area with 2 cc of 0.5% Marcaine and 1 cc of Kenalog 40 mg/mL.  No blood loss.  Band-Aid placed.  Postinjection instructions given    Impression and Recommendations:    The above documentation has been reviewed and is accurate and complete Judi Saa, DO

## 2022-12-05 ENCOUNTER — Encounter: Payer: Self-pay | Admitting: Family Medicine

## 2022-12-05 ENCOUNTER — Ambulatory Visit: Payer: 59 | Admitting: Family Medicine

## 2022-12-05 VITALS — BP 114/82 | HR 57 | Ht 72.0 in | Wt 248.0 lb

## 2022-12-05 DIAGNOSIS — M7061 Trochanteric bursitis, right hip: Secondary | ICD-10-CM | POA: Diagnosis not present

## 2022-12-05 DIAGNOSIS — M7062 Trochanteric bursitis, left hip: Secondary | ICD-10-CM

## 2022-12-05 NOTE — Assessment & Plan Note (Signed)
Bilateral injections given today, tolerated the procedure well.  Differential includes lumbar radiculopathy and will need to monitor.  Does have nerve damage from the back previously.  Do believe that the L4 and L3 areas could be potentially contributing to the weakness of the hips that are contributing to the symptoms that he is having.  Discussed proper shoes.  Home exercises for hip abductor strengthening given and follow-up again in 6 to 8 weeks

## 2022-12-05 NOTE — Patient Instructions (Signed)
Injected both hips today See me again in 6 weeks 

## 2023-01-16 ENCOUNTER — Ambulatory Visit: Payer: 59 | Admitting: Family Medicine

## 2023-01-29 ENCOUNTER — Other Ambulatory Visit: Payer: Self-pay | Admitting: Gastroenterology

## 2023-08-26 NOTE — Progress Notes (Deleted)
 [facility] [address] [place] [address] Phone: [phone] Subjective:    I'm seeing this patient by the request  of:  Britta King, MD  CC:   YEP:Dlagzrupcz  12/05/2022 Bilateral injections given today, tolerated the procedure well.  Differential includes lumbar radiculopathy and will need to monitor.  Does have nerve damage from the back previously.  Do believe that the L4 and L3 areas could be potentially contributing to the weakness of the hips that are contributing to the symptoms that he is having.  Discussed proper shoes.  Home exercises for hip abductor strengthening given and follow-up again in 6 to 8 weeks     Updated 09/02/2023 Timothy Brown is a 63 y.o. male coming in with complaint of LBP.       Past Medical History:  Diagnosis Date   Anxiety    GERD (gastroesophageal reflux disease)    TUMS   Gout    Hypertension    no meds now   Rosacea    Shortness of breath    WITH EXERTION   Past Surgical History:  Procedure Laterality Date   ANTERIOR CERVICAL DECOMP/DISCECTOMY FUSION N/A 05/19/2012   Procedure: ANTERIOR CERVICAL DECOMPRESSION/DISCECTOMY FUSION 2 LEVELS;  Surgeon: Reyes JONETTA Budge, MD;  Location: [facility];  Service: Neurosurgery;  Laterality: N/A;  C56 C67 anterior cervical decompression with fusion interbody prothesis plating and bonegraft   BREAST LUMPECTOMY Left    LUMP ON NIPPLE @ 63 YRS OLD   COLONOSCOPY WITH PROPOFOL  N/A 06/21/2016   Procedure: COLONOSCOPY WITH PROPOFOL ;  Surgeon: Ruel Kung, MD;  Location: [facility] ENDOSCOPY;  Service: Endoscopy;  Laterality: N/A;   COLONOSCOPY WITH PROPOFOL  N/A 10/30/2021   Procedure: COLONOSCOPY WITH PROPOFOL ;  Surgeon: Kung Ruel, MD;  Location: [facility];  Service: Gastroenterology;  Laterality: N/A;   ESOPHAGOGASTRODUODENOSCOPY (EGD) WITH PROPOFOL  N/A 10/30/2021   Procedure: ESOPHAGOGASTRODUODENOSCOPY (EGD) WITH PROPOFOL ;  Surgeon: Kung Ruel, MD;  Location: [facility]  ENDOSCOPY;  Service: Gastroenterology;  Laterality: N/A;   SHOULDER ARTHROSCOPY WITH ROTATOR CUFF REPAIR AND SUBACROMIAL DECOMPRESSION Right 02/14/2021   Procedure: SHOULDER ARTHROSCOPY WITH ROTATOR CUFF REPAIR AND SUBACROMIAL DECOMPRESSION;  Surgeon: Dozier Soulier, MD;  Location: [facility];  Service: Orthopedics;  Laterality: Right;   Social History   Socioeconomic History   Marital status: Single    Spouse name: Not on file   Number of children: Not on file   Years of education: Not on file   Highest education level: Not on file  Occupational History   Not on file  Tobacco Use   Smoking status: Never   Smokeless tobacco: Never  Vaping Use   Vaping status: Never Used  Substance and Sexual Activity   Alcohol use: Yes    Alcohol/week: 4.0 - 6.0 standard drinks of alcohol    Types: 4 - 6 Cans of beer per week    Comment: social   Drug use: Yes    Types: Marijuana    Comment: 40 YRS AGO   Sexual activity: Yes  Other Topics Concern   Not on file  Social History Narrative   Not on file   Social Drivers of Health   Financial Resource Strain: Not on file  Food Insecurity: No Food Insecurity (03/20/2022)   Hunger Vital Sign    Worried About Running Out of Food in the Last Year: Never true    Ran Out of Food in the Last Year: Never true  Transportation Needs: No Transportation  Needs (03/20/2022)   PRAPARE - Administrator, Civil Service (Medical): No    Lack of Transportation (Non-Medical): No  Physical Activity: Not on file  Stress: Not on file  Social Connections: Not on file   No Known Allergies Family History  Problem Relation Age of Onset   Lung cancer Mother    Lung cancer Father      Current Outpatient Medications (Cardiovascular):    sildenafil  (VIAGRA ) 100 MG tablet, TAKE 1 TABLET BY MOUTH AS DIRECTED AS NEEDED (Patient not taking: Reported on 02/20/2022)     Current Outpatient Medications (Other):    clobetasol  (TEMOVATE )  0.05 % external solution, Apply 1 Application topically 2 (two) times daily as needed.   diazepam  (VALIUM ) 5 MG tablet, Take 1 tablet (5 mg total) by mouth every 6 (six) hours as needed.   doxycycline  (VIBRA -TABS) 100 MG tablet, TAKE 1 TABLET BY MOUTH EVERY DAY (Patient taking differently: No sig reported)   linaclotide  (LINZESS ) 145 MCG CAPS capsule, Take 1 capsule (145 mcg total) by mouth daily.   omeprazole  (PRILOSEC) 40 MG capsule, TAKE 1 CAPSULE (40 MG TOTAL) BY MOUTH DAILY.   tiZANidine  (ZANAFLEX ) 2 MG tablet, TAKE 1 TABLET BY MOUTH AT BEDTIME.   Reviewed prior external information including notes and imaging from  primary care provider As well as notes that were available from care everywhere and other healthcare systems.  Past medical history, social, surgical and family history all reviewed in electronic medical record.  No pertanent information unless stated regarding to the chief complaint.   Review of Systems:  No headache, visual changes, nausea, vomiting, diarrhea, constipation, dizziness, abdominal pain, skin rash, fevers, chills, night sweats, weight loss, swollen lymph nodes, body aches, joint swelling, chest pain, shortness of breath, mood changes. POSITIVE muscle aches  Objective  There were no vitals taken for this visit.   General: No apparent distress alert and oriented x3 mood and affect normal, dressed appropriately.  HEENT: Pupils equal, extraocular movements intact  Respiratory: Patient's speak in full sentences and does not appear short of breath  Cardiovascular: No lower extremity edema, non tender, no erythema      Impression and Recommendations:

## 2023-08-26 NOTE — Progress Notes (Unsigned)
 [name] [address] [place] [address] Phone: [phone] Subjective:   LILLETTE Berwyn Posey, am serving as a scribe for Dr. Arthea Claudene.  I'm seeing this patient by the request  of:  Britta King, MD  CC: back and hips pain follow up   YEP:Dlagzrupcz  Timothy Brown is a 63 y.o. male coming in with complaint of LBP and hips. Last seen in Oct 2024 for GT bursitis. Patient states that pain in B Gts for past month. Lumbar spine has also been painful. 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 Getting little sleep. Taking Tylenol , Advil, Aleve. Using topical analgesics on hips and back. Had epidural years ago that was helpful.        Past Medical History:  Diagnosis Date   Anxiety    GERD (gastroesophageal reflux disease)    TUMS   Gout    Hypertension    no meds now   Rosacea    Shortness of breath    WITH EXERTION   Past Surgical History:  Procedure Laterality Date   ANTERIOR CERVICAL DECOMP/DISCECTOMY FUSION N/A 05/19/2012   Procedure: ANTERIOR CERVICAL DECOMPRESSION/DISCECTOMY FUSION 2 LEVELS;  Surgeon: Reyes JONETTA Budge, MD;  Location: [facility];  Service: Neurosurgery;  Laterality: N/A;  C56 C67 anterior cervical decompression with fusion interbody prothesis plating and bonegraft   BREAST LUMPECTOMY Left    LUMP ON NIPPLE @ 63 YRS OLD   COLONOSCOPY WITH PROPOFOL  N/A 06/21/2016   Procedure: COLONOSCOPY WITH PROPOFOL ;  Surgeon: Ruel Kung, MD;  Location: [facility] ENDOSCOPY;  Service: Endoscopy;  Laterality: N/A;   COLONOSCOPY WITH PROPOFOL  N/A 10/30/2021   Procedure: COLONOSCOPY WITH PROPOFOL ;  Surgeon: Kung Ruel, MD;  Location: [facility] ENDOSCOPY;  Service: Gastroenterology;  Laterality: N/A;   ESOPHAGOGASTRODUODENOSCOPY (EGD) WITH PROPOFOL  N/A 10/30/2021   Procedure: ESOPHAGOGASTRODUODENOSCOPY (EGD) WITH PROPOFOL ;  Surgeon: Kung Ruel, MD;  Location: [facility] END[facility]COPY;  Service: Gastroenterology;  Laterality: N/A;    SHOULDER ARTHROSCOPY WITH ROTATOR CUFF REPAIR AND SUBACROMIAL DECOMPRESSION Right 02/14/2021   Procedure: SHOULDER ARTHROSCOPY WITH ROTATOR CUFF REPAIR AND SUBACROMIAL DECOMPRESSION;  Surgeon: Dozier Soulier, MD;  Location: [facility];  Service: Orthopedics;  Laterality: Right;   Social History   Socioeconomic History   Marital status: Single    Spouse name: Not on file   Number of children: Not on file   Years of education: Not on file   Highest education level: Not on file  Occupational History   Not on file  Tobacco Use   Smoking status: Never   Smokeless tobacco: Never  Vaping Use   Vaping status: Never Used  Substance and Sexual Activity   Alcohol use: Yes    Alcohol/week: 4.0 - 6.0 standard drinks of alcohol    Types: 4 - 6 Cans of beer per week    Comment: social   Drug use: Yes    Types: Marijuana    Comment: 40 YRS AGO   Sexual activity: Yes  Other Topics Concern   Not on file  Social History Narrative   Not on file   Social Drivers of Health   Financial Resource Strain: Not on file  Food Insecurity: No Food Insecurity (03/20/2022)   Hunger Vital Sign    Worried About Running Out of Food in the Last Year: Never true    Ran Out of Food in the Last Year: Never true  Transportation Needs: No Transportation Needs (03/20/2022)  PRAPARE - Administrator, Civil Service (Medical): No    Lack of Transportation (Non-Medical): No  Physical Activity: Not on file  Stress: Not on file (02/05/2023)  Social Connections: Not on file   No Known Allergies Family History  Problem Relation Age of Onset   Lung cancer Mother    Lung cancer Father      Current Outpatient Medications (Cardiovascular):    sildenafil  (VIAGRA ) 100 MG tablet, TAKE 1 TABLET BY MOUTH AS DIRECTED AS NEEDED     Current Outpatient Medications (Other):    clobetasol  (TEMOVATE ) 0.05 % external solution, Apply 1 Application topically 2 (two) times daily as needed.    diazepam  (VALIUM ) 5 MG tablet, Take 1 tablet (5 mg total) by mouth every 6 (six) hours as needed.   doxycycline  (VIBRA -TABS) 100 MG tablet, TAKE 1 TABLET BY MOUTH EVERY DAY (Patient taking differently: No sig reported)   linaclotide  (LINZESS ) 145 MCG CAPS capsule, Take 1 capsule (145 mcg total) by mouth daily.   omeprazole  (PRILOSEC) 40 MG capsule, TAKE 1 CAPSULE (40 MG TOTAL) BY MOUTH DAILY.   tiZANidine  (ZANAFLEX ) 2 MG tablet, TAKE 1 TABLET BY MOUTH AT BEDTIME.   Reviewed prior external information including notes and imaging from  primary care provider As well as notes that were available from care everywhere and other healthcare systems.  Past medical history, social, surgical and family history all reviewed in electronic medical record.  No pertanent information unless stated regarding to the chief complaint.   Review of Systems:  No headache, visual changes, nausea, vomiting, diarrhea, constipation, dizziness, abdominal pain, skin rash, fevers, chills, night sweats, weight loss, swollen lymph nodes, body aches, joint swelling, chest pain, shortness of breath, mood changes. POSITIVE muscle aches  Objective  Blood pressure 110/62, pulse 63, height 6' (1.829 m), weight 262 lb (118.8 kg), SpO2 98%.   General: No apparent distress alert and oriented x3 mood and affect normal, dressed appropriately.  HEENT: Pupils equal, extraocular movements intact  Respiratory: Patient's speak in full sentences and does not appear short of breath  Cardiovascular: No lower extremity edema, non tender, no erythema  Back exam shows patient does have significant loss of lordosis.  Tightness noted in the paraspinal musculature.  Patient does have limited extension of the back noted today.  Worsening tightness of the hamstring from previous exam. Hip exam shows severe tenderness to palpation over the greater trochanteric area bilaterally.  Difficult to even do Deri test secondary to the tightness noted  today.    After verbal consent patient was prepped with alcohol swab and with a 21-gauge 2 inch needle injected into the right greater trochanteric area with 2 cc of 0.5% Marcaine  and 1 cc of Kenalog 40 mg/mL.  No blood loss.  Band-Aid placed.  Postinjection instructions given   After verbal consent patient was prepped with alcohol swab and with a 21-gauge 2 inch needle injected into the left greater trochanteric area with 2 cc of 0.5% Marcaine  and 1 cc of Kenalog 40 mg/mL.  No blood loss.  Band-Aid placed.  Postinjection instructions given    Impression and Recommendations:    The above documentation has been reviewed and is accurate and complete Virna Livengood M Aldene Hendon, DO

## 2023-08-29 ENCOUNTER — Ambulatory Visit: Payer: Self-pay | Admitting: Family Medicine

## 2023-08-29 ENCOUNTER — Ambulatory Visit

## 2023-08-29 ENCOUNTER — Encounter: Payer: Self-pay | Admitting: Family Medicine

## 2023-08-29 VITALS — BP 110/62 | HR 63 | Ht 72.0 in | Wt 262.0 lb

## 2023-08-29 DIAGNOSIS — E6609 Other obesity due to excess calories: Secondary | ICD-10-CM | POA: Diagnosis not present

## 2023-08-29 DIAGNOSIS — M7061 Trochanteric bursitis, right hip: Secondary | ICD-10-CM

## 2023-08-29 DIAGNOSIS — E66812 Obesity, class 2: Secondary | ICD-10-CM

## 2023-08-29 DIAGNOSIS — M5416 Radiculopathy, lumbar region: Secondary | ICD-10-CM

## 2023-08-29 DIAGNOSIS — M7062 Trochanteric bursitis, left hip: Secondary | ICD-10-CM

## 2023-08-29 DIAGNOSIS — Z6835 Body mass index (BMI) 35.0-35.9, adult: Secondary | ICD-10-CM | POA: Diagnosis not present

## 2023-08-29 DIAGNOSIS — M545 Low back pain, unspecified: Secondary | ICD-10-CM

## 2023-08-29 NOTE — Patient Instructions (Addendum)
 Injections today  Repeat epidural  X-rays on the way out  If not better in 2 weeks write us  and we will order MRI

## 2023-08-29 NOTE — Assessment & Plan Note (Signed)
 Once again concerns over this could be radicular symptoms.  Known L4-L5 and L5-S1 disc protrusions seen previously.  Will order another epidural at the L4-L5 with more of the lateral hip pain.  Depending on how patient responds may need more current advanced imaging if this does not seem to help.

## 2023-08-29 NOTE — Assessment & Plan Note (Signed)
 Repeat injections given today and tolerated the procedure well, discussed icing regimen and making certain range of motion.  I am concerned that lumbar radiculopathy is within the differential as well and patient has had known L4-L5 disc protrusion which patient did respond well to an epidural previously.  This was in January 2024.  Would consider the possibility of needing advanced imaging if continuing to have difficulty if this injection or the epidurals do not make a significant difference.  Patient wants to avoid any other type of medications if possible.  Encouraged weight loss.  Follow-up again in 6 to 8 weeks

## 2023-08-30 ENCOUNTER — Ambulatory Visit: Payer: Self-pay | Admitting: Family Medicine

## 2023-09-02 ENCOUNTER — Ambulatory Visit: Admitting: Family Medicine

## 2023-09-05 ENCOUNTER — Encounter: Payer: Self-pay | Admitting: Family Medicine

## 2023-09-05 NOTE — Discharge Instructions (Signed)

## 2023-09-09 ENCOUNTER — Ambulatory Visit
Admission: RE | Admit: 2023-09-09 | Discharge: 2023-09-09 | Disposition: A | Discharge status: Home / Self Care | Source: Ambulatory Visit | Attending: Family Medicine | Admitting: Family Medicine

## 2023-09-09 DIAGNOSIS — M7061 Trochanteric bursitis, right hip: Secondary | ICD-10-CM

## 2023-09-09 MED ORDER — IOPAMIDOL (ISOVUE-M 200) INJECTION 41%
1.0000 mL | Freq: Once | INTRAMUSCULAR | Status: AC
  Administered 2023-09-09: 1 mL via EPIDURAL

## 2023-09-09 MED ORDER — METHYLPREDNISOLONE ACETATE 40 MG/ML INJ SUSP (RADIOLOG
80.0000 mg | Freq: Once | INTRAMUSCULAR | Status: AC
  Administered 2023-09-09: 80 mg via EPIDURAL

## 2023-10-01 ENCOUNTER — Ambulatory Visit: Payer: Self-pay | Admitting: Family Medicine

## 2023-11-05 ENCOUNTER — Encounter: Payer: Self-pay | Admitting: Sports Medicine

## 2024-03-26 ENCOUNTER — Encounter: Payer: Self-pay | Admitting: Dermatology

## 2024-03-26 ENCOUNTER — Ambulatory Visit: Admitting: Dermatology

## 2024-03-26 DIAGNOSIS — L739 Follicular disorder, unspecified: Secondary | ICD-10-CM

## 2024-03-26 DIAGNOSIS — Z7189 Other specified counseling: Secondary | ICD-10-CM | POA: Diagnosis not present

## 2024-03-26 DIAGNOSIS — L700 Acne vulgaris: Secondary | ICD-10-CM

## 2024-03-26 MED ORDER — DOXYCYCLINE MONOHYDRATE 100 MG PO CAPS: 100.0000 mg | ORAL_CAPSULE | Freq: Two times a day (BID) | ORAL | 2 refills | Status: AC

## 2024-03-26 NOTE — Patient Instructions (Addendum)
 Continue Doxycycline  100mg  increasing to 1 pill twice a day with food and drink Start Hibiclens wash in shower to scalp, avoid eyes, ears     Stop Clobetasol  solution   Due to recent changes in healthcare laws, you may see results of your pathology and/or laboratory studies on MyChart before the doctors have had a chance to review them. We understand that in some cases there may be results that are confusing or concerning to you. Please understand that not all results are received at the same time and often the doctors may need to interpret multiple results in order to provide you with the best plan of care or course of treatment. Therefore, we ask that you please give us  2 business days to thoroughly review all your results before contacting the office for clarification. Should we see a critical lab result, you will be contacted sooner.   If You Need Anything After Your Visit  If you have any questions or concerns for your doctor, please call our main line at [phone] and press option 4 to reach your doctor's medical assistant. If no one answers, please leave a voicemail as directed and we will return your call as soon as possible. Messages left after 4 pm will be answered the following business day.   You may also send us  a message via MyChart. We typically respond to MyChart messages within 1-2 business days.  For prescription refills, please ask your pharmacy to contact our office. Our fax number is [phone].  If you have an urgent issue when the clinic is closed that cannot wait until the next business day, you can page your doctor at the number below.    Please note that while we do our best to be available for urgent issues outside of office hours, we are not available 24/7.   If you have an urgent issue and are unable to reach us , you may choose to seek medical care at your doctor's office, retail clinic, urgent care center, or emergency room.  If you have a medical  emergency, please immediately call 911 or go to the emergency department.  Pager Numbers  - Dr. Hester: [phone]  - Dr. Jackquline: [phone]  - Dr. Claudene: [phone]   - Dr. Raymund: [phone]  In the event of inclement weather, please call our main line at [phone] for an update on the status of any delays or closures.  Dermatology Medication Tips: Please keep the boxes that topical medications come in in order to help keep track of the instructions about where and how to use these. Pharmacies typically print the medication instructions only on the boxes and not directly on the medication tubes.   If your medication is too expensive, please contact our office at [phone] option 4 or send us  a message through MyChart.   We are unable to tell what your co-pay for medications will be in advance as this is different depending on your insurance coverage. However, we may be able to find a substitute medication at lower cost or fill out paperwork to get insurance to cover a needed medication.   If a prior authorization is required to get your medication covered by your insurance company, please allow us  1-2 business days to complete this process.  Drug prices often vary depending on where the prescription is filled and some pharmacies may offer cheaper prices.  The website www.goodrx.com contains coupons for medications through different pharmacies. The prices here do not account for what the cost may  be with help from insurance (it may be cheaper with your insurance), but the website can give you the price if you did not use any insurance.  - You can print the associated coupon and take it with your prescription to the pharmacy.  - You may also stop by our office during regular business hours and pick up a GoodRx coupon card.  - If you need your prescription sent electronically to a different pharmacy, notify our office through [facility] or by phone at [phone]  option 4.     Si Usted Necesita Algo Despus de Su Visita  Tambin puede enviarnos un mensaje a travs de Clinical Cytogeneticist. Por lo general respondemos a los mensajes de MyChart en el transcurso de 1 a 2 das hbiles.  Para renovar recetas, por favor pida a su farmacia que se ponga en contacto con nuestra oficina. Randi lakes de fax es [place] [phone].  Si tiene un asunto urgente cuando la clnica est cerrada y que no puede esperar hasta el siguiente da hbil, puede llamar/localizar a su doctor(a) al nmero que aparece a continuacin.   Por favor, tenga en cuenta que aunque hacemos todo lo posible para estar disponibles para asuntos urgentes fuera del horario de [place], no estamos disponibles las 24 horas del da, los 7 [address]  [address] de la [place].   Si tiene un problema urgente y no puede comunicarse con nosotros, puede optar por buscar atencin mdica  en el consultorio de su doctor(a), en una clnica privada, en un centro de atencin urgente o en una sala de emergencias.  Si tiene engineer, drilling, por favor llame inmediatamente al 911 o vaya a la sala de emergencias.  Nmeros de bper  - Dr. Hester: [phone]  - Dra. Jackquline: [phone]  - Dr. Claudene: [phone]  - Dra. Kitts: [phone]  En caso de inclemencias del [place], por favor llame a nuestra lnea principal al [phone] para una actualizacin sobre el estado de cualquier retraso o cierre.  Consejos para la medicacin en dermatologa: Por favor, guarde las cajas en las que vienen los medicamentos de uso tpico para ayudarle a seguir las instrucciones sobre dnde y cmo usarlos. Las farmacias generalmente imprimen las instrucciones del medicamento slo en las cajas y no directamente en los tubos del [place].   Si su medicamento es muy caro, por favor, pngase en contacto con landry rieger llamando al [phone] y presione la opcin 4 o envenos un mensaje a travs de Clinical Cytogeneticist.   No podemos decirle cul ser su  copago por los medicamentos por adelantado ya que esto es diferente dependiendo de la cobertura de su seguro. Sin embargo, es posible que podamos encontrar un medicamento sustituto a audiological scientist un formulario para que el seguro cubra el medicamento que se considera necesario.   Si se requiere una autorizacin previa para que su compaa de seguros cubra su medicamento, por favor permtanos de 1 a 2 das hbiles para completar este proceso.  Los precios de los medicamentos varan con frecuencia dependiendo del environmental consultant de dnde se surte la receta y alguna farmacias pueden ofrecer precios ms baratos.  El sitio web www.goodrx.com tiene cupones para medicamentos de health and safety inspector. Los precios aqu no tienen en cuenta lo que podra costar con la ayuda del seguro (puede ser ms barato con su seguro), pero el sitio web puede darle el precio si no utiliz tourist information centre manager.  - Puede imprimir el cupn correspondiente y llevarlo con su receta a la farmacia.  - Tambin  puede pasar por nuestra oficina durante el horario de atencin regular y education officer, museum una tarjeta de cupones de GoodRx.  - Si necesita que su receta se enve electrnicamente a una farmacia diferente, informe a nuestra oficina a travs de MyChart de [place] o por telfono llamando al [phone] y presione la opcin 4.

## 2024-03-26 NOTE — Progress Notes (Signed)
" ° °  New Patient Visit   Subjective  Timothy Brown is a 64 y.o. male who presents for the following: hx of breaking out on scalp ~5 yrs previously diagnosed  with Rosacea, tender, currently using Clobetasol  sol, and Doxycycline  100mg  po qd, otc medicated shampoo, Clindamycin in past,    The following portions of the chart were reviewed this encounter and updated as appropriate: medications, allergies, medical history  Review of Systems:  No other skin or systemic complaints except as noted in HPI or Assessment and Plan.  Objective  Well appearing patient in no apparent distress; mood and affect are within normal limits.   A focused examination was performed of the following areas: scalp  Relevant exam findings are noted in the Assessment and Plan.    Assessment & Plan   FOLLICULITIS/acne on scalp scalp Exam: follicular erythematous papules and excoriations on hair-bearing scalp (temporal occipital)  Folliculitis occurs due to inflammation of the superficial hair follicle (pore), resulting in acne-like lesions (pus bumps). It can be infectious (bacterial, fungal) or noninfectious (shaving, tight clothing, heat/sweat, medications).  Folliculitis can be acute or chronic and recommended treatment depends on the underlying cause of folliculitis.  Treatment Plan: Recommends starting Hibiclens wash to wash scalp daily, avoid eyes, ears Increase Doxycycline  to 100mg  1 po bid, take with food and drink D/c Clobetasol  sol, causes acne folliculitis when used chronically. May flare from stopping given chronic use  Discussed Isotretinoin low dose for ~66yr  Isotretinoin Counseling; Review and Contraception Counseling: Reviewed potential side effects of isotretinoin including xerosis, cheilitis, hepatitis, hyperlipidemia, and severe birth defects if taken by a pregnant woman.  Women on isotretinoin must be celibate (not having sex) or required to use at least 2 birth control methods to prevent  pregnancy (unless patient is a male of non-child bearing potential).  Females of child-bearing potential must have monthly pregnancy tests while on isotretinoin and report through [employer] (FDA monitoring program). Reviewed reports of suicidal ideation in those with a history of depression while taking isotretinoin and reports of diagnosis of inflammatory bowl disease (IBD) while taking isotretinoin as well as the lack of evidence for a causal relationship between isotretinoin, depression and IBD. Patient advised to reach out with any questions or concerns. Patient advised not to share pills or donate blood while on treatment or for one month after completing treatment. All patient's considering Isotretinoin must read and understand and sign Isotretinoin Consent Form and be registered with [employer].  Doxycycline  should be taken with food to prevent nausea. Do not lay down for 30 minutes after taking. Be cautious with sun exposure and use good sun protection while on this medication. Pregnant women should not take this medication.    FOLLICULITIS   This Visit - doxycycline  (MONODOX ) 100 MG capsule - Take 1 capsule (100 mg total) by mouth 2 (two) times daily. Take with food and drink ACNE VULGARIS   This Visit - doxycycline  (MONODOX ) 100 MG capsule - Take 1 capsule (100 mg total) by mouth 2 (two) times daily. Take with food and drink  Return in about 3 months (around 06/24/2024) for Folliculitis f/u.  I, Grayce Saunas, RMA, am acting as scribe for Boneta Sharps, MD .   Documentation: I have reviewed the above documentation for accuracy and completeness, and I agree with the above.  Boneta Sharps, MD     "

## 2024-04-10 NOTE — Progress Notes (Unsigned)
 " Timothy Brown Timothy Brown [address] [place] [address] Phone: [phone] Subjective:    I'm seeing this patient by the request  of:  Britta King, MD  CC:   YEP:[id number]  Timothy Brown is a 64 y.o. male coming in with complaint of X arm pain. Patient states       Past Medical History:  Diagnosis Date   Anxiety    GERD (gastroesophageal reflux disease)    TUMS   Gout    Hypertension    no meds now   Rosacea    Shortness of breath    WITH EXERTION   Past Surgical History:  Procedure Laterality Date   ANTERIOR CERVICAL DECOMP/DISCECTOMY FUSION N/A 05/19/2012   Procedure: ANTERIOR CERVICAL DECOMPRESSION/DISCECTOMY FUSION 2 LEVELS;  Surgeon: Reyes JONETTA Budge, MD;  Location: [facility];  Service: Neurosurgery;  Laterality: N/A;  C56 C67 anterior cervical decompression with fusion interbody prothesis plating and bonegraft   BREAST LUMPECTOMY Left    LUMP ON NIPPLE @ 64 YRS OLD   COLONOSCOPY WITH PROPOFOL  N/A 06/21/2016   Procedure: COLONOSCOPY WITH PROPOFOL ;  Surgeon: Ruel Kung, MD;  Location: [facility];  Service: Endoscopy;  Laterality: N/A;   COLONOSCOPY WITH PROPOFOL  N/A 10/30/2021   Procedure: COLONOSCOPY WITH PROPOFOL ;  Surgeon: Kung Ruel, MD;  Location: [facility];  Service: Gastroenterology;  Laterality: N/A;   ESOPHAGOGASTRODUODENOSCOPY (EGD) WITH PROPOFOL  N/A 10/30/2021   Procedure: ESOPHAGOGASTRODUODENOSCOPY (EGD) WITH PROPOFOL ;  Surgeon: Kung Ruel, MD;  Location: [facility];  Service: Gastroenterology;  Laterality: N/A;   SHOULDER ARTHROSCOPY WITH ROTATOR CUFF REPAIR AND SUBACROMIAL DECOMPRESSION Right 02/14/2021   Procedure: SHOULDER ARTHROSCOPY WITH ROTATOR CUFF REPAIR AND SUBACROMIAL DECOMPRESSION;  Surgeon: Dozier Soulier, MD;  Location: [facility];  Service: Orthopedics;  Laterality: Right;   Social History   Socioeconomic History   Marital status: Single    Spouse name: Not on file   Number of  children: Not on file   Years of education: Not on file   Highest education level: Not on file  Occupational History   Not on file  Tobacco Use   Smoking status: Never   Smokeless tobacco: Never  Vaping Use   Vaping status: Never Used  Substance and Sexual Activity   Alcohol use: Yes    Alcohol/week: 4.0 - 6.0 standard drinks of alcohol    Types: 4 - 6 Cans of beer per week    Comment: social   Drug use: Yes    Types: Marijuana    Comment: 40 YRS AGO   Sexual activity: Yes  Other Topics Concern   Not on file  Social History Narrative   Not on file   Social Drivers of Health   Tobacco Use: Low Risk (03/26/2024)   Patient History    Smoking Tobacco Use: Never    Smokeless Tobacco Use: Never    Passive Exposure: Not on file  Financial Resource Strain: Not on file  Food Insecurity: No Food Insecurity (03/20/2022)   Hunger Vital Sign    Worried About Running Out of Food in the Last Year: Never true    Ran Out of Food in the Last Year: Never true  Transportation Needs: No Transportation Needs (03/20/2022)   PRAPARE - Administrator, Civil Service (Medical): No    Lack of Transportation (Non-Medical): No  Physical Activity: Not on file  Stress: Not on file (02/05/2023)  Social Connections: Not on file  Depression (EYV7-0): Low Risk (  02/20/2022)   Depression (PHQ2-9)    PHQ-2 Score: 0  Alcohol Screen: Not on file  Housing: Not on file  Utilities: Not on file  Health Literacy: Not on file   Allergies[1] Family History  Problem Relation Age of Onset   Lung cancer Mother    Lung cancer Father     Current Outpatient Medications (Cardiovascular):    sildenafil  (VIAGRA ) 100 MG tablet, TAKE 1 TABLET BY MOUTH AS DIRECTED AS NEEDED  Current Outpatient Medications (Other):    clobetasol  (TEMOVATE ) 0.05 % external solution, Apply 1 Application topically 2 (two) times daily as needed.   doxycycline  (MONODOX ) 100 MG capsule, Take 1 capsule (100 mg total) by mouth 2  (two) times daily. Take with food and drink   Reviewed prior external information including notes and imaging from  primary care provider As well as notes that were available from care everywhere and other healthcare systems.  Past medical history, social, surgical and family history all reviewed in electronic medical record.  No pertanent information unless stated regarding to the chief complaint.   Review of Systems:  No headache, visual changes, nausea, vomiting, diarrhea, constipation, dizziness, abdominal pain, skin rash, fevers, chills, night sweats, weight loss, swollen lymph nodes, body aches, joint swelling, chest pain, shortness of breath, mood changes. POSITIVE muscle aches  Objective  There were no vitals taken for this visit.   General: No apparent distress alert and oriented x3 mood and affect normal, dressed appropriately.  HEENT: Pupils equal, extraocular movements intact  Respiratory: Patient's speak in full sentences and does not appear short of breath  Cardiovascular: No lower extremity edema, non tender, no erythema      Impression and Recommendations:           [1] No Known Allergies  "

## 2024-04-13 ENCOUNTER — Ambulatory Visit: Admitting: Family Medicine

## 2024-04-21 ENCOUNTER — Ambulatory Visit: Admitting: Family Medicine

## 2024-06-25 ENCOUNTER — Ambulatory Visit: Admitting: Dermatology
